# Patient Record
Sex: Female | Born: 1955 | Race: White | Hispanic: No | Marital: Married | Smoking: Never smoker
Health system: Southern US, Community
[De-identification: ages and names within clinical notes are randomized; demographics above are authoritative.]

## PROBLEM LIST (undated history)

## (undated) DIAGNOSIS — I10 Essential (primary) hypertension: Secondary | ICD-10-CM

## (undated) DIAGNOSIS — R569 Unspecified convulsions: Secondary | ICD-10-CM

## (undated) DIAGNOSIS — D332 Benign neoplasm of brain, unspecified: Secondary | ICD-10-CM

## (undated) DIAGNOSIS — M810 Age-related osteoporosis without current pathological fracture: Secondary | ICD-10-CM

## (undated) DIAGNOSIS — D649 Anemia, unspecified: Secondary | ICD-10-CM

## (undated) DIAGNOSIS — Z8679 Personal history of other diseases of the circulatory system: Secondary | ICD-10-CM

## (undated) HISTORY — PX: OTHER SURGICAL HISTORY: SHX169

## (undated) HISTORY — PX: SHOULDER SURGERY: SHX246

---

## 2005-03-31 ENCOUNTER — Ambulatory Visit: Payer: Self-pay | Admitting: Family Medicine

## 2006-07-22 ENCOUNTER — Ambulatory Visit: Payer: Self-pay | Admitting: Family Medicine

## 2008-02-03 ENCOUNTER — Ambulatory Visit: Payer: Self-pay | Admitting: Family Medicine

## 2008-02-17 ENCOUNTER — Ambulatory Visit: Payer: Self-pay | Admitting: Family Medicine

## 2008-06-19 ENCOUNTER — Ambulatory Visit: Payer: Self-pay | Admitting: Unknown Physician Specialty

## 2008-09-06 ENCOUNTER — Ambulatory Visit: Payer: Self-pay | Admitting: Family Medicine

## 2010-05-16 ENCOUNTER — Ambulatory Visit: Payer: Self-pay | Admitting: Family Medicine

## 2012-04-11 ENCOUNTER — Ambulatory Visit: Payer: Self-pay | Admitting: Emergency Medicine

## 2012-09-02 ENCOUNTER — Ambulatory Visit: Payer: Self-pay

## 2014-01-05 DIAGNOSIS — M533 Sacrococcygeal disorders, not elsewhere classified: Secondary | ICD-10-CM | POA: Insufficient documentation

## 2014-10-19 DIAGNOSIS — Z780 Asymptomatic menopausal state: Secondary | ICD-10-CM | POA: Insufficient documentation

## 2014-10-31 ENCOUNTER — Other Ambulatory Visit: Payer: Self-pay | Admitting: Physician Assistant

## 2014-10-31 DIAGNOSIS — Z1231 Encounter for screening mammogram for malignant neoplasm of breast: Secondary | ICD-10-CM

## 2014-11-15 ENCOUNTER — Ambulatory Visit
Admission: RE | Admit: 2014-11-15 | Discharge: 2014-11-15 | Disposition: A | Discharge status: Home / Self Care | Payer: 59 | Source: Ambulatory Visit | Attending: Physician Assistant | Admitting: Physician Assistant

## 2014-11-15 DIAGNOSIS — Z1231 Encounter for screening mammogram for malignant neoplasm of breast: Secondary | ICD-10-CM

## 2015-10-23 ENCOUNTER — Other Ambulatory Visit: Payer: Self-pay | Admitting: Physician Assistant

## 2015-10-23 DIAGNOSIS — Z1231 Encounter for screening mammogram for malignant neoplasm of breast: Secondary | ICD-10-CM

## 2015-11-16 ENCOUNTER — Ambulatory Visit
Admission: RE | Admit: 2015-11-16 | Discharge: 2015-11-16 | Disposition: A | Discharge status: Home / Self Care | Payer: 59 | Source: Ambulatory Visit | Attending: Physician Assistant | Admitting: Physician Assistant

## 2015-11-16 DIAGNOSIS — Z1231 Encounter for screening mammogram for malignant neoplasm of breast: Secondary | ICD-10-CM | POA: Insufficient documentation

## 2016-11-11 ENCOUNTER — Other Ambulatory Visit: Payer: Self-pay | Admitting: Physician Assistant

## 2016-11-11 DIAGNOSIS — I10 Essential (primary) hypertension: Secondary | ICD-10-CM | POA: Insufficient documentation

## 2016-11-11 DIAGNOSIS — Z1231 Encounter for screening mammogram for malignant neoplasm of breast: Secondary | ICD-10-CM

## 2016-11-11 DIAGNOSIS — R7303 Prediabetes: Secondary | ICD-10-CM | POA: Insufficient documentation

## 2016-12-17 ENCOUNTER — Ambulatory Visit
Admission: RE | Admit: 2016-12-17 | Discharge: 2016-12-17 | Disposition: A | Discharge status: Home / Self Care | Payer: 59 | Source: Ambulatory Visit | Attending: Physician Assistant | Admitting: Physician Assistant

## 2016-12-17 DIAGNOSIS — Z1231 Encounter for screening mammogram for malignant neoplasm of breast: Secondary | ICD-10-CM | POA: Diagnosis not present

## 2017-11-11 ENCOUNTER — Other Ambulatory Visit: Payer: Self-pay | Admitting: Physician Assistant

## 2017-11-11 DIAGNOSIS — Z1231 Encounter for screening mammogram for malignant neoplasm of breast: Secondary | ICD-10-CM

## 2017-12-22 ENCOUNTER — Ambulatory Visit
Admission: RE | Admit: 2017-12-22 | Discharge: 2017-12-22 | Disposition: A | Discharge status: Home / Self Care | Payer: 59 | Source: Ambulatory Visit | Attending: Physician Assistant | Admitting: Physician Assistant

## 2017-12-22 ENCOUNTER — Encounter: Payer: Self-pay | Admitting: Radiology

## 2017-12-22 DIAGNOSIS — Z1231 Encounter for screening mammogram for malignant neoplasm of breast: Secondary | ICD-10-CM | POA: Diagnosis present

## 2018-01-17 ENCOUNTER — Emergency Department
Admission: EM | Admit: 2018-01-17 | Discharge: 2018-01-17 | Disposition: A | Discharge status: Home / Self Care | Payer: 59 | Attending: Emergency Medicine | Admitting: Emergency Medicine

## 2018-01-17 ENCOUNTER — Other Ambulatory Visit: Payer: Self-pay

## 2018-01-17 DIAGNOSIS — R55 Syncope and collapse: Secondary | ICD-10-CM | POA: Insufficient documentation

## 2018-01-17 LAB — URINALYSIS, COMPLETE (UACMP) WITH MICROSCOPIC
BILIRUBIN URINE: NEGATIVE
Bacteria, UA: NONE SEEN
GLUCOSE, UA: NEGATIVE mg/dL
KETONES UR: NEGATIVE mg/dL
LEUKOCYTES UA: NEGATIVE
NITRITE: NEGATIVE
PH: 7 (ref 5.0–8.0)
Protein, ur: NEGATIVE mg/dL
SPECIFIC GRAVITY, URINE: 1.012 (ref 1.005–1.030)

## 2018-01-17 LAB — BASIC METABOLIC PANEL
Anion gap: 9 (ref 5–15)
BUN: 9 mg/dL (ref 8–23)
CALCIUM: 9.2 mg/dL (ref 8.9–10.3)
CO2: 27 mmol/L (ref 22–32)
Chloride: 107 mmol/L (ref 98–111)
Creatinine, Ser: 0.72 mg/dL (ref 0.44–1.00)
GFR calc non Af Amer: >60 mL/min (ref >60)
GLUCOSE: 92 mg/dL (ref 70–99)
Potassium: 3.6 mmol/L (ref 3.5–5.1)
Sodium: 143 mmol/L (ref 135–145)

## 2018-01-17 LAB — TROPONIN I: Troponin I: <0.03 ng/mL (ref <0.03)

## 2018-01-17 LAB — MAGNESIUM: Magnesium: 2.2 mg/dL (ref 1.7–2.4)

## 2018-01-17 LAB — GLUCOSE, CAPILLARY: GLUCOSE-CAPILLARY: 88 mg/dL (ref 70–99)

## 2018-01-17 LAB — CBC
HEMATOCRIT: 43.9 % (ref 35.0–47.0)
Hemoglobin: 14.5 g/dL (ref 12.0–16.0)
MCH: 26.6 pg (ref 26.0–34.0)
MCHC: 33 g/dL (ref 32.0–36.0)
MCV: 80.6 fL (ref 80.0–100.0)
Platelets: 203 10*3/uL (ref 150–440)
RBC: 5.45 MIL/uL — ABNORMAL HIGH (ref 3.80–5.20)
RDW: 14.8 % — AB (ref 11.5–14.5)
WBC: 7.4 10*3/uL (ref 3.6–11.0)

## 2018-01-17 MED ORDER — SODIUM CHLORIDE 0.9 % IV BOLUS
1000.0000 mL | Freq: Once | INTRAVENOUS | Status: AC
  Administered 2018-01-17: 1000 mL via INTRAVENOUS

## 2018-01-17 NOTE — ED Provider Notes (Signed)
[facility] Emergency Department Provider Note  ____________________________________________  Time seen: Approximately 1:10 PM  I have reviewed the triage vital signs and the nursing notes.   HISTORY  Chief Complaint Loss of Consciousness   HPI Barbara Burch is a 62 y.o. female with a history of hypertension, prediabetes,  anemia, and migraines who presents for evaluation of a syncopal event.  Patient reports that this morning she had a bowl of cereal and went to church.  She was sitting on the choir.  She says that she was trying to sing louder than the men and was really exerting herself.  She was walking out of the stage when she felt dizzy and passed out falling on her husband was behind her.  She did not injure herself.  She was out for a few seconds.  No seizure-like activity, no urinary or bowel loss.  Patient reports having one episode of syncope several years ago.  She denies headache, palpitations, chest pain, shortness of breath, back pain preceding or after the syncope.  Patient reports that for the last month she has been having occasional episodes of chest pain that she describes as pressure located in the center of her chest that she has been attributing to GERD.  She reports that the pain comes on at rest or exertion, is not worse after eating, and may last an hour at a time.  She has no nausea, diaphoresis, or shortness of breath associated with it.  She reports that her last episode of pain was a week ago.  Patient reports family history of cardiac disease in her mother and uncles.  She has never smoked.  PMH Essential hypertension 11/11/2016  Prediabetes 11/11/2016  Postmenopausal 10/19/2014  SI (sacroiliac) joint dysfunction 01/05/2014  Anemia, unspecified   Seasonal allergies   Migraines   History of epilepsy   Overview:   remote history   Osteopenia of multiple sites      Allergies Amoxicillin; Cefuroxime axetil; Codeine;  Ibuprofen; and Sulfa antibiotics  Family History  Problem Relation Age of Onset  . Breast cancer Cousin     Social History Social History   Tobacco Use  . Smoking status: Never Smoker  . Smokeless tobacco: Never Used  Substance Use Topics  . Alcohol use: Never    Frequency: Never  . Drug use: Never    Review of Systems  Constitutional: Negative for fever. + syncope Eyes: Negative for visual changes. ENT: Negative for sore throat. Neck: No neck pain  Cardiovascular: Negative for chest pain. Respiratory: Negative for shortness of breath. Gastrointestinal: Negative for abdominal pain, vomiting or diarrhea. Genitourinary: Negative for dysuria. Musculoskeletal: Negative for back pain. Skin: Negative for rash. Neurological: Negative for headaches, weakness or numbness. Psych: No SI or HI  ____________________________________________   PHYSICAL EXAM:  VITAL SIGNS: ED Triage Vitals  Enc Vitals Group     BP 01/17/18 1217 (!) 153/76     Pulse Rate 01/17/18 1218 76     Resp 01/17/18 1218 14     Temp 01/17/18 1240 97.8 F (36.6 C)     Temp Source 01/17/18 1240 Oral     SpO2 01/17/18 1218 99 %     Weight 01/17/18 1241 180 lb (81.6 kg)     Height 01/17/18 1241 5\' 2"  (1.575 m)     Head Circumference --      Peak Flow --      Pain Score 01/17/18 1241 0     Pain Loc --  Pain Edu? --      Excl. in [place]? --     Constitutional: Alert and oriented. Well appearing and in no apparent distress. HEENT:      Head: Normocephalic and atraumatic.         Eyes: Conjunctivae are normal. Sclera is non-icteric.       Mouth/Throat: Mucous membranes are moist.       Neck: Supple with no signs of meningismus. Cardiovascular: Regular rate and rhythm. No murmurs, gallops, or rubs. 2+ symmetrical distal pulses are present in all extremities. No JVD. Respiratory: Normal respiratory effort. Lungs are clear to auscultation bilaterally. No wheezes, crackles, or rhonchi.  Gastrointestinal:  Soft, non tender, and non distended with positive bowel sounds. No rebound or guarding. Musculoskeletal: Nontender with normal range of motion in all extremities. No edema, cyanosis, or erythema of extremities. Neurologic: Normal speech and language. A & O x3, PERRL, EOMI, no nystagmus, CN II-XII intact, motor testing reveals good tone and bulk throughout. There is no evidence of pronator drift or dysmetria. Muscle strength is 5/5 throughout. Sensory examination is intact. Gait deferred. Skin: Skin is warm, dry and intact. No rash noted. Psychiatric: Mood and affect are normal. Speech and behavior are normal.  ____________________________________________   LABS (all labs ordered are listed, but only abnormal results are displayed)  Labs Reviewed  CBC - Abnormal; Notable for the following components:      Result Value   RBC 5.45 (*)    RDW 14.8 (*)    All other components within normal limits  URINALYSIS, COMPLETE (UACMP) WITH MICROSCOPIC - Abnormal; Notable for the following components:   Color, Urine YELLOW (*)    APPearance CLEAR (*)    Hgb urine dipstick SMALL (*)    All other components within normal limits  GLUCOSE, CAPILLARY  BASIC METABOLIC PANEL  TROPONIN I  MAGNESIUM   ____________________________________________  EKG  ED ECG REPORT I, Rudene Re, the attending physician, personally viewed and interpreted this ECG.  Normal sinus rhythm, normal intervals, normal axis, no STE or depressions, no evidence of HOCM, AV block, delta wave, ARVD, prolonged QTc, WPW, or Brugada.   ____________________________________________  RADIOLOGY  none ____________________________________________   PROCEDURES  Procedure(s) performed: None Procedures Critical Care performed:  None ____________________________________________   INITIAL IMPRESSION / ASSESSMENT AND PLAN / ED COURSE   62 y.o. female with a history of hypertension, prediabetes,  anemia, and migraines who  presents for evaluation of a syncopal event preceded by dizziness after sitting in the choir at church.  Patient has no complaints at this time.  She is well-appearing, no distress, she has normal vital signs, she is completely neurologically intact, she has no murmurs on cardiac auscultation, she has no leg swelling.  EKG shows no evidence of dysrhythmias or ischemia.  Will check labs to rule out anemia, AKI, electrolyte abnormalities, dehydration.  Will give IV fluids.  Will monitor on telemetry to rule out cardiac dysrhythmia.  Clinical Course as of Jan 18 1511  Sun Jan 17, 2018  1511 Patient reports that her dizziness has resolved after IV fluids.  Labs showing no electrolyte abnormalities, no anemia, no dehydration, no hypoglycemia.  Patient was monitored on telemetry for several hours with no dysrhythmias.  At this time she is stable for discharge home.  Patient will be referred to cardiology for further evaluation for these episodes of chest pain and syncope.  Discussed return precautions for chest pain, syncope, shortness of breath.   [CV]  Clinical Course User Index [CV] Alfred Levins [place], MD     As part of my medical decision making, I reviewed the following data within the [place] History obtained from family, Nursing notes reviewed and incorporated, Labs reviewed , EKG interpreted , Old chart reviewed, Notes from prior ED visits and [place] Controlled Substance Database    Pertinent labs & imaging results that were available during my care of the patient were reviewed by me and considered in my medical decision making (see chart for details).    ____________________________________________   FINAL CLINICAL IMPRESSION(S) / ED DIAGNOSES  Final diagnoses:  Syncope, unspecified syncope type      NEW MEDICATIONS STARTED DURING THIS VISIT:  ED Discharge Orders    None       Note:  This document was prepared using Dragon voice recognition software and may  include unintentional dictation errors.    Alfred Levins, [place], MD 01/17/18 [phone]

## 2018-01-17 NOTE — ED Notes (Signed)
Pt ambulated to restroom, steady gait, stand by assist.

## 2018-01-18 ENCOUNTER — Other Ambulatory Visit: Payer: Self-pay | Admitting: Physician Assistant

## 2018-01-18 ENCOUNTER — Ambulatory Visit
Admission: RE | Admit: 2018-01-18 | Discharge: 2018-01-18 | Disposition: A | Discharge status: Home / Self Care | Payer: 59 | Source: Ambulatory Visit | Attending: Physician Assistant | Admitting: Physician Assistant

## 2018-01-18 DIAGNOSIS — R55 Syncope and collapse: Secondary | ICD-10-CM | POA: Insufficient documentation

## 2018-01-21 DIAGNOSIS — E782 Mixed hyperlipidemia: Secondary | ICD-10-CM | POA: Insufficient documentation

## 2018-03-09 DIAGNOSIS — R0789 Other chest pain: Secondary | ICD-10-CM | POA: Insufficient documentation

## 2018-06-02 ENCOUNTER — Emergency Department: Payer: 59

## 2018-06-02 ENCOUNTER — Encounter: Payer: Self-pay | Admitting: Emergency Medicine

## 2018-06-02 ENCOUNTER — Other Ambulatory Visit: Payer: Self-pay

## 2018-06-02 ENCOUNTER — Emergency Department
Admission: EM | Admit: 2018-06-02 | Discharge: 2018-06-02 | Disposition: A | Discharge status: Home / Self Care | Payer: 59 | Attending: Emergency Medicine | Admitting: Emergency Medicine

## 2018-06-02 DIAGNOSIS — Z79899 Other long term (current) drug therapy: Secondary | ICD-10-CM | POA: Diagnosis not present

## 2018-06-02 DIAGNOSIS — R079 Chest pain, unspecified: Secondary | ICD-10-CM | POA: Diagnosis present

## 2018-06-02 DIAGNOSIS — I10 Essential (primary) hypertension: Secondary | ICD-10-CM | POA: Insufficient documentation

## 2018-06-02 DIAGNOSIS — E041 Nontoxic single thyroid nodule: Secondary | ICD-10-CM | POA: Diagnosis not present

## 2018-06-02 DIAGNOSIS — R1011 Right upper quadrant pain: Secondary | ICD-10-CM | POA: Insufficient documentation

## 2018-06-02 DIAGNOSIS — K8050 Calculus of bile duct without cholangitis or cholecystitis without obstruction: Secondary | ICD-10-CM | POA: Diagnosis not present

## 2018-06-02 DIAGNOSIS — R911 Solitary pulmonary nodule: Secondary | ICD-10-CM | POA: Diagnosis not present

## 2018-06-02 HISTORY — DX: Age-related osteoporosis without current pathological fracture: M81.0

## 2018-06-02 HISTORY — DX: Essential (primary) hypertension: I10

## 2018-06-02 HISTORY — DX: Unspecified convulsions: R56.9

## 2018-06-02 LAB — LIPASE, BLOOD: LIPASE: 30 U/L (ref 11–51)

## 2018-06-02 LAB — CBC WITH DIFFERENTIAL/PLATELET
Abs Immature Granulocytes: 0.03 10*3/uL (ref 0.00–0.07)
BASOS ABS: 0 10*3/uL (ref 0.0–0.1)
Basophils Relative: 0 %
Eosinophils Absolute: 0.1 10*3/uL (ref 0.0–0.5)
Eosinophils Relative: 1 %
HCT: 43.7 % (ref 36.0–46.0)
HEMOGLOBIN: 13.8 g/dL (ref 12.0–15.0)
IMMATURE GRANULOCYTES: 0 %
LYMPHS PCT: 13 %
Lymphs Abs: 1.2 10*3/uL (ref 0.7–4.0)
MCH: 26.1 pg (ref 26.0–34.0)
MCHC: 31.6 g/dL (ref 30.0–36.0)
MCV: 82.8 fL (ref 80.0–100.0)
Monocytes Absolute: 0.4 10*3/uL (ref 0.1–1.0)
Monocytes Relative: 5 %
NEUTROS PCT: 81 %
NRBC: 0 % (ref 0.0–0.2)
Neutro Abs: 7.7 10*3/uL (ref 1.7–7.7)
Platelets: 215 10*3/uL (ref 150–400)
RBC: 5.28 MIL/uL — AB (ref 3.87–5.11)
RDW: 13.6 % (ref 11.5–15.5)
WBC: 9.4 10*3/uL (ref 4.0–10.5)

## 2018-06-02 LAB — COMPREHENSIVE METABOLIC PANEL
ALT: 15 U/L (ref 0–44)
ANION GAP: 9 (ref 5–15)
AST: 26 U/L (ref 15–41)
Albumin: 4.1 g/dL (ref 3.5–5.0)
Alkaline Phosphatase: 94 U/L (ref 38–126)
BUN: 9 mg/dL (ref 8–23)
CHLORIDE: 103 mmol/L (ref 98–111)
CO2: 29 mmol/L (ref 22–32)
CREATININE: 0.74 mg/dL (ref 0.44–1.00)
Calcium: 9.1 mg/dL (ref 8.9–10.3)
Glucose, Bld: 134 mg/dL — ABNORMAL HIGH (ref 70–99)
POTASSIUM: 3.5 mmol/L (ref 3.5–5.1)
SODIUM: 141 mmol/L (ref 135–145)
Total Bilirubin: 0.4 mg/dL (ref 0.3–1.2)
Total Protein: 7.7 g/dL (ref 6.5–8.1)

## 2018-06-02 LAB — FIBRIN DERIVATIVES D-DIMER (ARMC ONLY): Fibrin derivatives D-dimer (ARMC): 612.34 ng/mL (FEU) — ABNORMAL HIGH (ref 0.00–499.00)

## 2018-06-02 LAB — TROPONIN I
Troponin I: <0.03 ng/mL (ref <0.03)
Troponin I: <0.03 ng/mL (ref <0.03)

## 2018-06-02 LAB — GLUCOSE, CAPILLARY: Glucose-Capillary: 109 mg/dL — ABNORMAL HIGH (ref 70–99)

## 2018-06-02 MED ORDER — SODIUM CHLORIDE 0.9 % IV BOLUS
1000.0000 mL | Freq: Once | INTRAVENOUS | Status: AC
  Administered 2018-06-02: 1000 mL via INTRAVENOUS

## 2018-06-02 MED ORDER — ONDANSETRON HCL 4 MG/2ML IJ SOLN
4.0000 mg | Freq: Once | INTRAMUSCULAR | Status: AC
  Administered 2018-06-02: 4 mg via INTRAVENOUS
  Filled 2018-06-02: qty 2

## 2018-06-02 MED ORDER — IOHEXOL 350 MG/ML SOLN
75.0000 mL | Freq: Once | INTRAVENOUS | Status: AC | PRN
  Administered 2018-06-02: 75 mL via INTRAVENOUS

## 2018-06-02 MED ORDER — FAMOTIDINE IN NACL 20-0.9 MG/50ML-% IV SOLN
20.0000 mg | Freq: Once | INTRAVENOUS | Status: AC
  Administered 2018-06-02: 20 mg via INTRAVENOUS
  Filled 2018-06-02: qty 50

## 2018-06-02 MED ORDER — MORPHINE SULFATE (PF) 4 MG/ML IV SOLN
4.0000 mg | Freq: Once | INTRAVENOUS | Status: AC
  Administered 2018-06-02: 4 mg via INTRAVENOUS
  Filled 2018-06-02: qty 1

## 2018-06-02 MED ORDER — TRAMADOL HCL 50 MG PO TABS: 50.0000 mg | ORAL_TABLET | Freq: Four times a day (QID) | ORAL | 0 refills | Status: AC | PRN

## 2018-06-02 MED ORDER — ASPIRIN 325 MG PO TABS
325.0000 mg | ORAL_TABLET | Freq: Once | ORAL | Status: AC
  Administered 2018-06-02: 325 mg via ORAL
  Filled 2018-06-02: qty 1

## 2018-06-02 MED ORDER — ONDANSETRON 4 MG PO TBDP: ORAL_TABLET | ORAL | 0 refills | Status: AC

## 2018-06-02 MED ORDER — PROMETHAZINE HCL 25 MG/ML IJ SOLN
12.5000 mg | Freq: Once | INTRAMUSCULAR | Status: AC
  Administered 2018-06-02: 12.5 mg via INTRAVENOUS
  Filled 2018-06-02: qty 1

## 2018-06-02 NOTE — ED Notes (Signed)
Patient transported to X-ray 

## 2018-06-02 NOTE — ED Notes (Signed)
Pt ambulatory to bathroom w/ assist. States she feels weak d/t not eating since last night

## 2018-06-02 NOTE — ED Notes (Signed)
ED Provider at bedside. 

## 2018-06-02 NOTE — ED Notes (Signed)
Walked pt to the restroom. Walked pt back to bed. Sent urine to lab.

## 2018-06-02 NOTE — ED Triage Notes (Signed)
Pt c/o mid sternal cp radiating to back, described as sharp, that awoke her out of sleep today at 0430AM. Pt endorses nausea and SOB since CP began.   Pt alert and oriented X4, active, cooperative, pt in NAD. RR even and unlabored, color WNL.

## 2018-06-02 NOTE — ED Notes (Signed)
Patient transported to Ultrasound 

## 2018-06-02 NOTE — ED Provider Notes (Signed)
[facility] EMERGENCY DEPARTMENT Provider Note   CSN: [id number] Arrival date & time: 06/02/18  2585     History   Chief Complaint Chief Complaint  Patient presents with  . Chest Pain    HPI Barbara Burch is a 62 y.o. female history of hypertension, previous syncope here presenting with chest pain, shortness of breath.  Patient states that she woke around 4:30 AM with acute onset of left-sided chest pain associated with some shortness of breath.  States that the pain is sharp and does radiate to her back.  Patient states that she felt lightheaded and dizzy like her she is going to pass out as well.  She came to the ED in July for syncope and had a negative troponins.  He also followed up with Dr. Nehemiah Massed from cardiology and had an echo that showed mild mitral regurgitation and a normal stress test.  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  Patient denies any recent travel.  Patient took some Pepto-Bismol with minimal relief.  The history is provided by the patient.    Past Medical History:  Diagnosis Date  . Hypertension   . Osteoporosis   . Seizures ([place])     There are no active problems to display for this patient.   History reviewed. No pertinent surgical history.   OB History   None      Home Medications    Prior to Admission medications   Medication Sig Start Date End Date Taking? Authorizing Provider  Calcium Carbonate-Vit D-Min (CALTRATE 600+D PLUS MINERALS) 600-800 MG-UNIT TABS Take 1 tablet by mouth daily.   Yes [provider]  Cholecalciferol (D3 HIGH POTENCY) 2000 units CAPS Take 2,000 Units by mouth daily.   Yes [provider]  Cyanocobalamin (VITAMIN B-12) 1000 MCG SUBL Take 1,000 mcg by mouth daily.   Yes [provider]    Family History Family History  Problem Relation Age of Onset  . Breast cancer Cousin     Social  History Social History   Tobacco Use  . Smoking status: Never Smoker  . Smokeless tobacco: Never Used  Substance Use Topics  . Alcohol use: Never    Frequency: Never  . Drug use: Never     Allergies   Amoxicillin; Cefuroxime axetil; Codeine; Ibuprofen; and Sulfa antibiotics   Review of Systems Review of Systems  Cardiovascular: Positive for chest pain.  All other systems reviewed and are negative.    Physical Exam Updated Vital Signs BP 133/74   Pulse 73   Temp 97.7 F (36.5 C) (Oral)   Resp 13   Ht 4\' 10"  (1.473 m)   Wt 71.7 kg   SpO2 98%   BMI 33.02 kg/m   Physical Exam  Constitutional: She is oriented to person, place, and time.  Uncomfortable   HENT:  Head: Normocephalic.  Eyes: Pupils are equal, round, and reactive to light. EOM are normal.  Neck: Normal range of motion. Neck supple.  Cardiovascular: Normal rate, regular rhythm and normal pulses.  Pulmonary/Chest: Effort normal and breath sounds normal.  No obvious reproducible tenderness   Abdominal: Soft. Bowel sounds are normal.  Musculoskeletal: Normal range of motion.       Right lower leg: Normal. She exhibits no tenderness and no edema.       Left lower leg: Normal. She exhibits no tenderness and no edema.  Neurological: She is alert and  oriented to person, place, and time.  Skin: Skin is warm. Capillary refill takes less than 2 seconds.  Psychiatric: She has a normal mood and affect. Her behavior is normal.  Nursing note and vitals reviewed.    ED Treatments / Results  Labs (all labs ordered are listed, but only abnormal results are displayed) Labs Reviewed  COMPREHENSIVE METABOLIC PANEL - Abnormal; Notable for the following components:      Result Value   Glucose, Bld 134 (*)    All other components within normal limits  CBC WITH DIFFERENTIAL/PLATELET - Abnormal; Notable for the following components:   RBC 5.28 (*)    All other components within normal limits  FIBRIN DERIVATIVES  D-DIMER ([facility] ONLY) - Abnormal; Notable for the following components:   Fibrin derivatives D-dimer [facility]) 612.34 (*)    All other components within normal limits  GLUCOSE, CAPILLARY - Abnormal; Notable for the following components:   Glucose-Capillary 109 (*)    All other components within normal limits  TROPONIN I  TROPONIN I  LIPASE, BLOOD    EKG None  Radiology Dg Chest 2 View  Result Date: 06/02/2018 CLINICAL DATA:  Mid chest pain, nausea, vomiting, shortness of breath EXAM: CHEST - 2 VIEW COMPARISON:  None. FINDINGS: Heart and mediastinal contours are within normal limits. No focal opacities or effusions. No acute bony abnormality. IMPRESSION: No active cardiopulmonary disease. Electronically Signed   By: Rolm Baptise M.D.   On: 06/02/2018 09:51   Ct Angio Chest Pe W And/or Wo Contrast  Result Date: 06/02/2018 CLINICAL DATA:  Chest pain and shortness of breath. Elevated D-dimer EXAM: CT ANGIOGRAPHY CHEST WITH CONTRAST TECHNIQUE: Multidetector CT imaging of the chest was performed using the standard protocol during bolus administration of intravenous contrast. Multiplanar CT image reconstructions and MIPs were obtained to evaluate the vascular anatomy. CONTRAST:  67mL OMNIPAQUE IOHEXOL 350 MG/ML SOLN COMPARISON:  Chest radiograph June 02, 2018 FINDINGS: Cardiovascular: There is no demonstrable pulmonary embolus. There is no thoracic aortic aneurysm or dissection. Visualized great vessels appear unremarkable. No pericardial effusion or pericardial thickening evident. Mediastinum/Nodes: There is a nodular lesion in the isthmus of the thyroid toward the left measuring 9 x 9 mm. Thyroid otherwise appears unremarkable. There is no appreciable thoracic adenopathy. Subcentimeter thoracic lymph nodes are noted which do not meet size criteria for pathologic significance. No esophageal lesions are evident. Lungs/Pleura: There is atelectatic change in the lung bases. There are no foci of edema or  consolidation. There is a nodular opacity in the anterior segment of the right upper lobe seen on axial slice 22 series 6 measuring 4 mm. There is a 1 mm nodular opacity in the anterior segment of the right upper lobe seen on axial slice 27 series 6. There is a 2 mm nodular opacity in the medial segment of the right lower lobe seen on axial slice 35 series 6. There is a 1 mm nodular opacity in the superior segment of the right lower lobe seen on axial slice 27 series 6. There is a 4 mm nodular opacity in the medial segment right lower lobe seen on axial slice 33 series 6. There is a 4 mm nodular opacity in the lateral segment of the right middle lobe seen on axial slice 45 series 6. There is a 2 mm nodular opacity in the apical segment of the left upper lobe seen on axial slice 8 series 6. No pleural effusion or pleural thickening evident. Upper Abdomen: Visualized upper abdominal structures appear  unremarkable. Musculoskeletal: There are foci of degenerative change in the thoracic spine. There are no blastic or lytic bone lesions. No intramuscular lesions are evident. Review of the MIP images confirms the above findings. IMPRESSION: 1. No demonstrable pulmonary embolus. No thoracic aortic aneurysm or dissection. 2. No lung edema or consolidation. Several nodular opacities seen in the lungs, largest measuring 4 mm. No follow-up needed if patient is low-risk (and has no known or suspected primary neoplasm). Non-contrast chest CT can be considered in 12 months if patient is high-risk. This recommendation follows the consensus statement: Guidelines for Management of Incidental Pulmonary Nodules Detected on CT Images: From the Fleischner Society 2017; Radiology 2017; 284:228-243. 3. 9 mm nodule in the isthmus of the thyroid on the left. Per consensus guidelines, a thyroid nodule of this size does not warrant additional imaging surveillance. 4. Occasional subcentimeter mediastinal lymph nodes. No adenopathy evident by  size criteria. Electronically Signed   By: Lowella Grip III M.D.   On: 06/02/2018 11:10   US Abdomen Limited Ruq  Result Date: 06/02/2018 CLINICAL DATA:  Right upper quadrant pain EXAM: ULTRASOUND ABDOMEN LIMITED RIGHT UPPER QUADRANT COMPARISON:  None. FINDINGS: Gallbladder: Within the gallbladder, there are echogenic foci which move and shadow consistent with cholelithiasis. Largest gallstone measures 2.4 cm in length. There is no gallbladder wall thickening or pericholecystic fluid. No sonographic Murphy sign noted by sonographer. Common bile duct: Diameter: 5 mm. No intrahepatic or extrahepatic biliary duct dilatation. Liver: No focal lesion identified. Liver echogenicity overall is increased. Portal vein is patent on color Doppler imaging with normal direction of blood flow towards the liver. IMPRESSION: 1. Cholelithiasis. No gallbladder wall thickening or pericholecystic fluid. 2. Increase in liver echogenicity, a finding indicative of hepatic steatosis. While no focal liver lesions are evident on this study, it must be cautioned that the sensitivity of ultrasound for detection of focal liver lesions is diminished in this circumstance. Electronically Signed   By: Lowella Grip III M.D.   On: 06/02/2018 14:39    Procedures Procedures (including critical care time)  Medications Ordered in ED Medications  famotidine (PEPCID) IVPB 20 mg premix ( Intravenous Stopped 06/02/18 1031)  aspirin tablet 325 mg (325 mg Oral Given 06/02/18 0950)  morphine 4 MG/ML injection 4 mg (4 mg Intravenous Given 06/02/18 0949)  ondansetron (ZOFRAN) injection 4 mg (4 mg Intravenous Given 06/02/18 0949)  sodium chloride 0.9 % bolus 1,000 mL (0 mLs Intravenous Stopped 06/02/18 1105)  iohexol (OMNIPAQUE) 350 MG/ML injection 75 mL (75 mLs Intravenous Contrast Given 06/02/18 1056)  promethazine (PHENERGAN) injection 12.5 mg (12.5 mg Intravenous Given 06/02/18 1436)  sodium chloride 0.9 % bolus 1,000 mL (1,000 mLs  Intravenous New Bag/Given 06/02/18 1433)     Initial Impression / Assessment and Plan / ED Course  I have reviewed the triage vital signs and the nursing notes.  Pertinent labs & imaging results that were available during my care of the patient were reviewed by me and considered in my medical decision making (see chart for details).    Barbara Burch is a 62 y.o. female here with chest pain, back pain. Patient not hypertensive. Low suspicion for dissection. Consider ACS vs reflux vs MSK vs PE. Had recent stress test that was normal. Will get labs, Trop x 2, d-dimer. Will give pepcid, ASA, pain meds and reassess.   3:13 PM D-dimer was positive, delta trop neg. Patient had some nausea and vomiting and told me that she has family hx of gallstones. US  showed cholelithiasis, but no acute chole. No vomiting after nausea meds. Stable for dc. Will dc home with zofran, pain meds, surgery follow up.    Final Clinical Impressions(s) / ED Diagnoses   Final diagnoses:  RUQ pain    ED Discharge Orders    None       Drenda Freeze, MD 06/02/18 1514

## 2018-06-02 NOTE — Discharge Instructions (Addendum)
Eat low fat diet.   You have gallstones.   Take tylenol for pain   Take tramadol for severe pain.   Take zofran for nausea. Stay hydrated.   See your doctor.   See surgery for your gallstones.   You have incidental findings of thyroid nodule and lung nodule that can be followed up with your doctor  Return to ER if you have worse abdominal pain, vomiting, chest pain, trouble breathing, fever

## 2018-06-09 ENCOUNTER — Other Ambulatory Visit: Payer: Self-pay

## 2018-06-09 ENCOUNTER — Encounter: Payer: Self-pay | Admitting: General Surgery

## 2018-06-09 ENCOUNTER — Ambulatory Visit (INDEPENDENT_AMBULATORY_CARE_PROVIDER_SITE_OTHER): Payer: 59 | Admitting: General Surgery

## 2018-06-09 VITALS — BP 152/85 | HR 63 | Temp 97.7°F | Resp 16 | Ht 59.0 in | Wt 157.2 lb

## 2018-06-09 DIAGNOSIS — K8020 Calculus of gallbladder without cholecystitis without obstruction: Secondary | ICD-10-CM | POA: Diagnosis not present

## 2018-06-09 DIAGNOSIS — J302 Other seasonal allergic rhinitis: Secondary | ICD-10-CM | POA: Insufficient documentation

## 2018-06-09 DIAGNOSIS — G43909 Migraine, unspecified, not intractable, without status migrainosus: Secondary | ICD-10-CM | POA: Insufficient documentation

## 2018-06-09 DIAGNOSIS — D649 Anemia, unspecified: Secondary | ICD-10-CM | POA: Insufficient documentation

## 2018-06-09 DIAGNOSIS — M8589 Other specified disorders of bone density and structure, multiple sites: Secondary | ICD-10-CM | POA: Insufficient documentation

## 2018-06-09 DIAGNOSIS — Z8669 Personal history of other diseases of the nervous system and sense organs: Secondary | ICD-10-CM | POA: Insufficient documentation

## 2018-06-09 NOTE — Progress Notes (Signed)
Patient ID: Barbara Burch, female   DOB: 1955/07/31, 62 y.o.   MRN: [id number]  Chief Complaint  Patient presents with  . New Patient (Initial Visit)    cholelithiais    HPI Barbara Burch is a 62 y.o. female.  She was recently seen in the ED for chest pain.  The HPI of her ED note is copied here:  "Barbara Burch is a 62 y.o. female history of hypertension, previous syncope here presenting with chest pain, shortness of breath.  Patient states that she woke around 4:30 AM with acute onset of left-sided chest pain associated with some shortness of breath.  States that the pain is sharp and does radiate to her back.  Patient states that she felt lightheaded and dizzy like her she is going to pass out as well.  She came to the ED in July for syncope and had a negative troponins.  He also followed up with Dr. Nehemiah Massed from cardiology and had an echo that showed mild mitral regurgitation and a normal stress test.  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  Patient denies any recent travel.  Patient took some Pepto-Bismol with minimal relief."  After the patient reported a history of cholelithiasis in her family, the evaluation took a different tack.  A RUQ U/S was performed that revealed multiple stones, the largest measuring 2.4 cm.  Once all of the life-threatening etiologies were ruled out, she was sent home and scheduled to see me in follow up.    Today, she reports that her episode that brought her to the ED was accompanied by bloating, nausea, and chills.  These have resolved. She gives a history of nearly daily chest pain (points to mid sternum) that is present upon waking and goes away in about 30 minutes. She has never been jaundiced, had pancreatitis, or experienced tea-colored urine or clay-colored stools.  She did have PUD in her 95's.     Past Medical History:  Diagnosis Date  . Hypertension   . Osteoporosis   . Seizures  ([place])     Past Surgical History:  Procedure Laterality Date  . CESAREAN SECTION    . fooft  Right   . SHOULDER SURGERY Left     Family History  Problem Relation Age of Onset  . Colon cancer Cousin   . Gallbladder disease Mother   . Heart failure Mother   . Cancer Maternal Grandfather     Social History Social History   Tobacco Use  . Smoking status: Never Smoker  . Smokeless tobacco: Never Used  Substance Use Topics  . Alcohol use: Never    Frequency: Never  . Drug use: Never    Allergies  Allergen Reactions  . Amoxicillin Nausea Only and Other (See Comments)    Jitteriness Has patient had a PCN reaction causing immediate rash, facial/tongue/throat swelling, SOB or lightheadedness with hypotension: No Has patient had a PCN reaction causing severe rash involving mucus membranes or skin necrosis: No Has patient had a PCN reaction that required hospitalization: No Has patient had a PCN reaction occurring within the last 10 years: No If all of the above answers are "NO", then may proceed with Cephalosporin use.   . Cefuroxime Axetil Other (See Comments)  . Codeine Other (See Comments)    Loss of consciousness  . Ibuprofen Other (See Comments)    Shaky.  ALEVE OK.  Marland Kitchen Penicillins Nausea And  Vomiting and Nausea Only    Vomiting, dizzy.   . Sulfa Antibiotics Nausea Only    Does not work.    Current Outpatient Medications  Medication Sig Dispense Refill  . Calcium Carbonate-Vit D-Min (CALTRATE 600+D PLUS MINERALS) 600-800 MG-UNIT TABS Take 1 tablet by mouth daily.    . Cholecalciferol (D3 HIGH POTENCY) 2000 units CAPS Take 2,000 Units by mouth daily.    . Cyanocobalamin (VITAMIN B-12) 1000 MCG SUBL Take 1,000 mcg by mouth daily.    . ondansetron (ZOFRAN ODT) 4 MG disintegrating tablet 4mg  ODT q4 hours prn nausea/vomit 10 tablet 0  . traMADol (ULTRAM) 50 MG tablet Take 1 tablet (50 mg total) by mouth every 6 (six) hours as needed. 10 tablet 0   No current  facility-administered medications for this visit.     Review of Systems Review of Systems  All other systems reviewed and are negative. or per the HPI  Blood pressure (!) 152/85, pulse 63, temperature 97.7 F (36.5 C), temperature source Temporal, resp. rate 16, height 4\' 11"  (1.499 m), weight 157 lb 3.2 oz (71.3 kg), SpO2 95 %.  Physical Exam Physical Exam  Constitutional: She is oriented to person, place, and time. She appears well-developed and well-nourished. No distress.  HENT:  Head: Normocephalic and atraumatic.  Mouth/Throat: Oropharynx is clear and moist. No oropharyngeal exudate.  Eyes: Pupils are equal, round, and reactive to light. Right eye exhibits no discharge. Left eye exhibits no discharge. No scleral icterus.  Neck: No tracheal deviation present. No thyromegaly present.  Cardiovascular: Normal rate, regular rhythm, normal heart sounds and intact distal pulses.  Pulmonary/Chest: Effort normal and breath sounds normal.  Abdominal: Soft. Bowel sounds are normal. She exhibits no mass. There is tenderness. There is no rebound and no guarding.  To deep palpation in the RUQ. No Murphy sign.   Musculoskeletal: She exhibits no edema or deformity.  Lymphadenopathy:    She has no cervical adenopathy.  Neurological: She is alert and oriented to person, place, and time.  Skin: Skin is warm and dry.  Psychiatric: She has a normal mood and affect.    Data Reviewed Results for Barbara Burch, Barbara Burch (MRN [id number]) as of 06/09/2018 13:54  Ref. Range 06/02/2018 09:16  Sodium Latest Ref Range: 135 - 145 mmol/L 141  Potassium Latest Ref Range: 3.5 - 5.1 mmol/L 3.5  Chloride Latest Ref Range: 98 - 111 mmol/L 103  CO2 Latest Ref Range: 22 - 32 mmol/L 29  Glucose Latest Ref Range: 70 - 99 mg/dL 134 (H)  BUN Latest Ref Range: 8 - 23 mg/dL 9  Creatinine Latest Ref Range: 0.44 - 1.00 mg/dL 0.74  Calcium Latest Ref Range: 8.9 - 10.3 mg/dL 9.1  Anion gap Latest Ref Range: 5 - 15  9  Alkaline  Phosphatase Latest Ref Range: 38 - 126 U/L 94  Albumin Latest Ref Range: 3.5 - 5.0 g/dL 4.1  AST Latest Ref Range: 15 - 41 U/L 26  ALT Latest Ref Range: 0 - 44 U/L 15  Total Protein Latest Ref Range: 6.5 - 8.1 g/dL 7.7  Total Bilirubin Latest Ref Range: 0.3 - 1.2 mg/dL 0.4  GFR, Est Non African American Latest Ref Range: >60 mL/min >60  GFR, Est African American Latest Ref Range: >60 mL/min >60  Troponin I Latest Ref Range: <0.03 ng/mL <0.03  WBC Latest Ref Range: 4.0 - 10.5 K/uL 9.4  RBC Latest Ref Range: 3.87 - 5.11 MIL/uL 5.28 (H)  Hemoglobin Latest Ref Range: 12.0 -  15.0 g/dL 13.8  HCT Latest Ref Range: 36.0 - 46.0 % 43.7  MCV Latest Ref Range: 80.0 - 100.0 fL 82.8  MCH Latest Ref Range: 26.0 - 34.0 pg 26.1  MCHC Latest Ref Range: 30.0 - 36.0 g/dL 31.6  RDW Latest Ref Range: 11.5 - 15.5 % 13.6  Platelets Latest Ref Range: 150 - 400 K/uL 215  nRBC Latest Ref Range: 0.0 - 0.2 % 0.0  Neutrophils Latest Units: % 81  Lymphocytes Latest Units: % 13  Monocytes Relative Latest Units: % 5  Eosinophil Latest Units: % 1  Basophil Latest Units: % 0  Immature Granulocytes Latest Units: % 0  NEUT# Latest Ref Range: 1.7 - 7.7 K/uL 7.7  Lymphocyte # Latest Ref Range: 0.7 - 4.0 K/uL 1.2  Monocyte # Latest Ref Range: 0.1 - 1.0 K/uL 0.4  Eosinophils Absolute Latest Ref Range: 0.0 - 0.5 K/uL 0.1  Basophils Absolute Latest Ref Range: 0.0 - 0.1 K/uL 0.0  Abs Immature Granulocytes Latest Ref Range: 0.00 - 0.07 K/uL 0.03  CLINICAL DATA:  Right upper quadrant pain  EXAM: ULTRASOUND ABDOMEN LIMITED RIGHT UPPER QUADRANT  COMPARISON:  None.  FINDINGS: Gallbladder:  Within the gallbladder, there are echogenic foci which move and shadow consistent with cholelithiasis. Largest gallstone measures 2.4 cm in length. There is no gallbladder wall thickening or pericholecystic fluid. No sonographic Murphy sign noted by sonographer.  Common bile duct:  Diameter: 5 mm. No intrahepatic or  extrahepatic biliary duct dilatation.  Liver:  No focal lesion identified. Liver echogenicity overall is increased. Portal vein is patent on color Doppler imaging with normal direction of blood flow towards the liver.  IMPRESSION: 1. Cholelithiasis. No gallbladder wall thickening or pericholecystic fluid.  2. Increase in liver echogenicity, a finding indicative of hepatic steatosis. While no focal liver lesions are evident on this study, it must be cautioned that the sensitivity of ultrasound for detection of focal liver lesions is diminished in this circumstance.   Assessment    62 y/o F presenting today for ED follow up after evaluation for chest pain revealed cholelithiasis.      Plan    We discussed the natural history of gallstones, that they are very common and do not cause symptoms in the majority of patients. I'm still not entirely sure that her symptoms last week actually are entirely attributable to her stones, as much of her complaint is localized to the mid-epigastrium and mid-sternum, hence the ED evaluation for cardiovascular source.  She has an appointment with GI next week, which I think is appropriate, as she may have a hiatal hernia or other esophageal/gastric pathology.  In the case that her gallstones were, in fact, the cause of her symptoms, I did offer her the option of having her gallbladder removed versus observing and waiting to see if she has another attack. She would like to undergo cholecystectomy, however, she prefers to have this done at [facility].  We will make the referral.  No return visit scheduled.Fredirick Maudlin 06/09/2018, 1:52 PM

## 2018-06-09 NOTE — Patient Instructions (Signed)
We will send the referral to [facility]. Someone from their office will contact you to schedule an appointment.

## 2018-09-03 ENCOUNTER — Other Ambulatory Visit: Payer: Self-pay | Admitting: Physician Assistant

## 2018-09-08 ENCOUNTER — Other Ambulatory Visit (HOSPITAL_COMMUNITY): Payer: Self-pay | Admitting: Physician Assistant

## 2018-09-08 ENCOUNTER — Other Ambulatory Visit: Payer: Self-pay | Admitting: Physician Assistant

## 2018-09-08 DIAGNOSIS — H8110 Benign paroxysmal vertigo, unspecified ear: Secondary | ICD-10-CM

## 2018-09-16 ENCOUNTER — Other Ambulatory Visit: Payer: Self-pay

## 2018-09-16 ENCOUNTER — Ambulatory Visit
Admission: RE | Admit: 2018-09-16 | Discharge: 2018-09-16 | Disposition: A | Discharge status: Home / Self Care | Payer: 59 | Source: Ambulatory Visit | Attending: Physician Assistant | Admitting: Physician Assistant

## 2018-09-16 DIAGNOSIS — H8110 Benign paroxysmal vertigo, unspecified ear: Secondary | ICD-10-CM | POA: Insufficient documentation

## 2018-09-16 LAB — POCT I-STAT CREATININE: Creatinine, Ser: 0.8 mg/dL (ref 0.44–1.00)

## 2018-09-16 MED ORDER — GADOBUTROL 1 MMOL/ML IV SOLN
7.0000 mL | Freq: Once | INTRAVENOUS | Status: AC | PRN
  Administered 2018-09-16: 7 mL via INTRAVENOUS

## 2018-11-15 ENCOUNTER — Other Ambulatory Visit: Payer: Self-pay | Admitting: Physician Assistant

## 2018-11-15 DIAGNOSIS — Z1231 Encounter for screening mammogram for malignant neoplasm of breast: Secondary | ICD-10-CM

## 2018-12-28 ENCOUNTER — Ambulatory Visit
Admission: RE | Admit: 2018-12-28 | Discharge: 2018-12-28 | Disposition: A | Discharge status: Home / Self Care | Payer: 59 | Source: Ambulatory Visit | Attending: Physician Assistant | Admitting: Physician Assistant

## 2018-12-28 ENCOUNTER — Other Ambulatory Visit: Payer: Self-pay

## 2018-12-28 DIAGNOSIS — Z1231 Encounter for screening mammogram for malignant neoplasm of breast: Secondary | ICD-10-CM | POA: Insufficient documentation

## 2019-06-23 IMAGING — US US ABDOMEN LIMITED
1 series · 14 of 25 positions shown · non-contrast
Comparison: None.

CLINICAL DATA: Right upper quadrant pain

EXAM:
ULTRASOUND ABDOMEN LIMITED RIGHT UPPER QUADRANT

[Series 1: us abdomen limited · 0.20mm/px · 55 acquisitions, 14 frames shown]
[im 1/55]
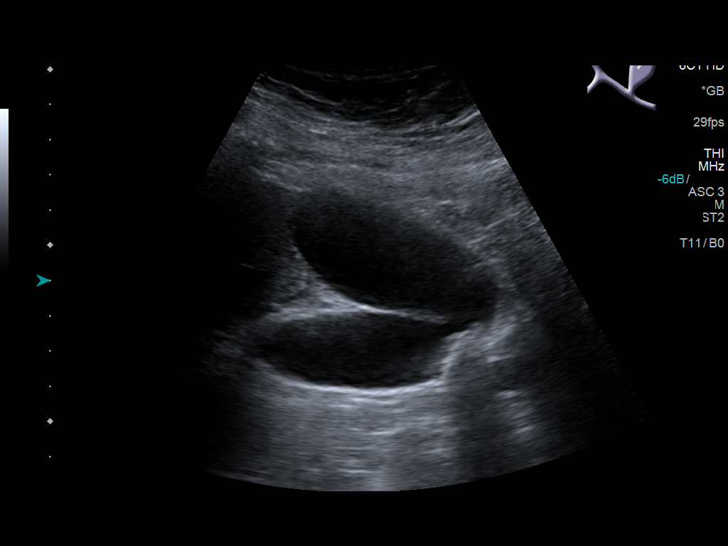
[im 5/55]
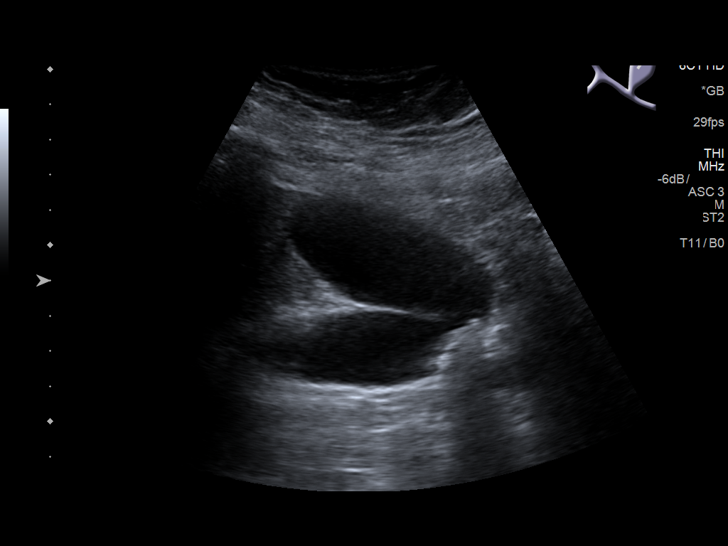
[im 10/55]
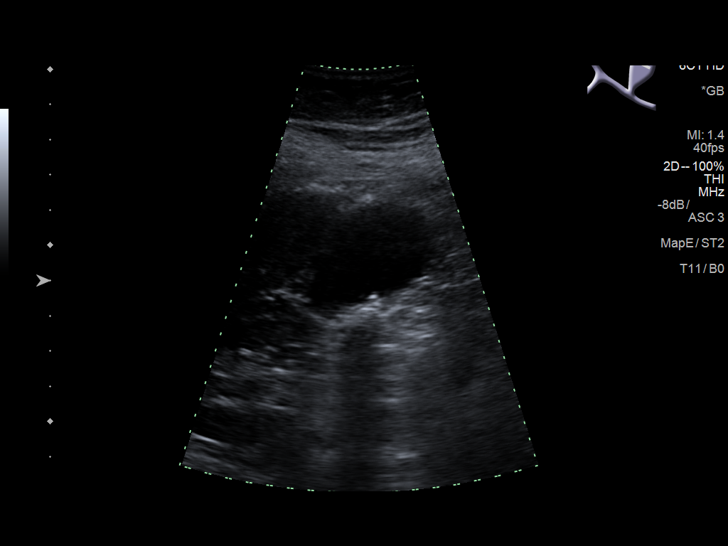
[im 14/55]
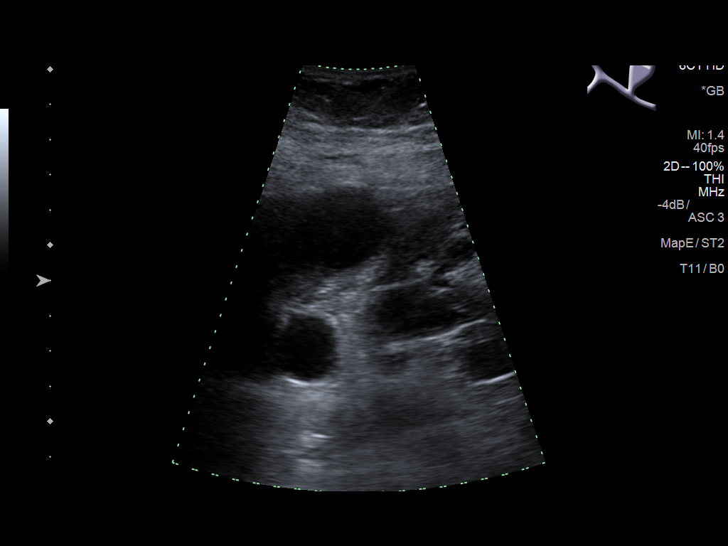
[im 19/55]
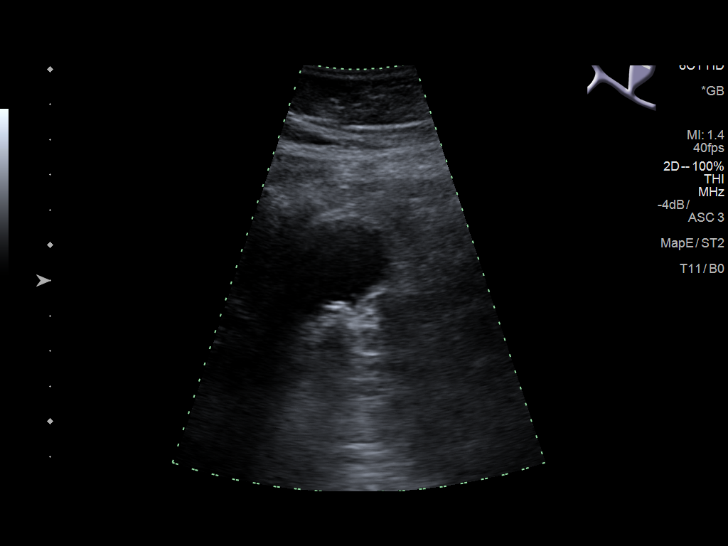
[im 21/55]
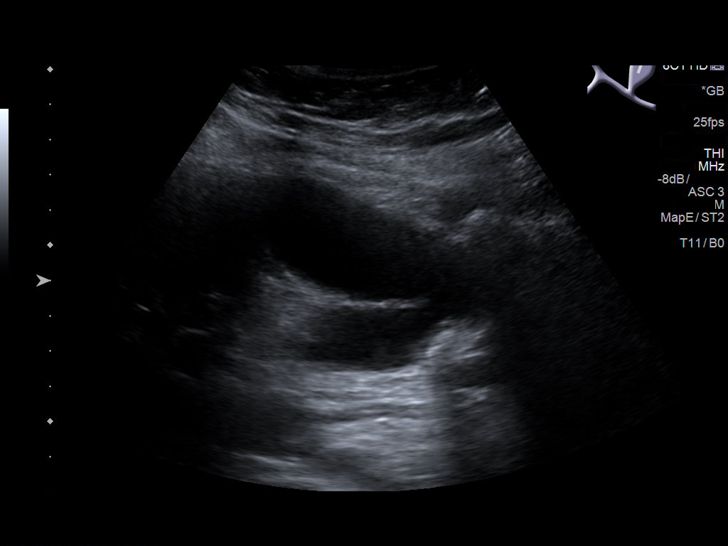
[im 25/55]
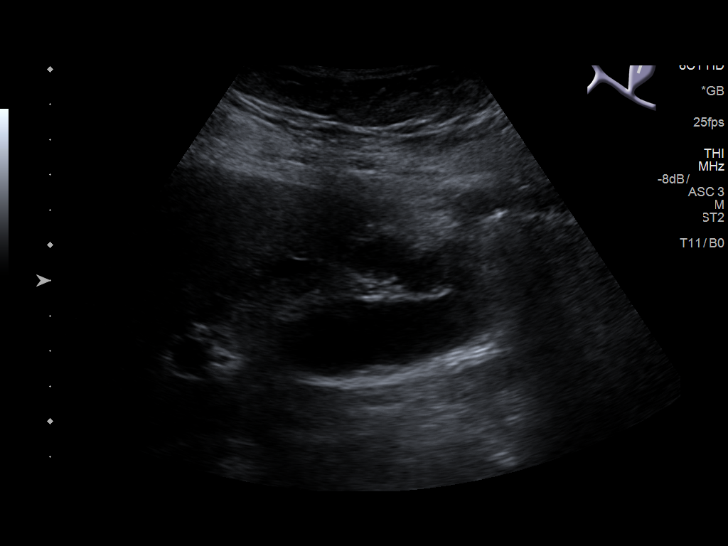
[im 30/55]
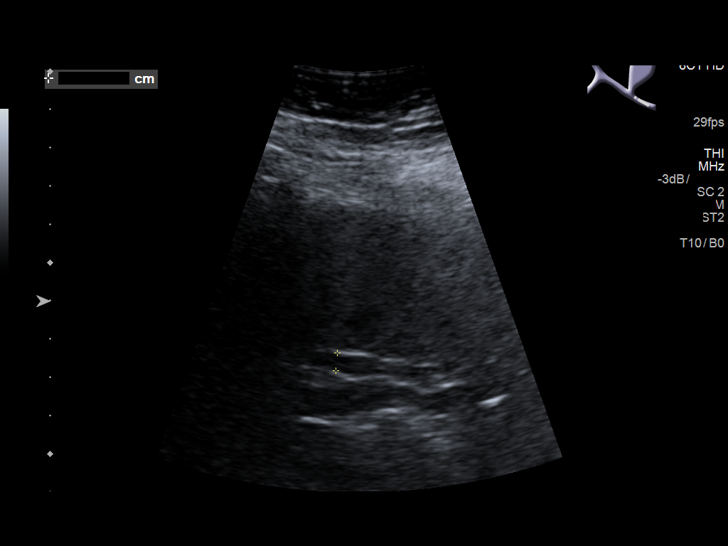
[im 34/55]
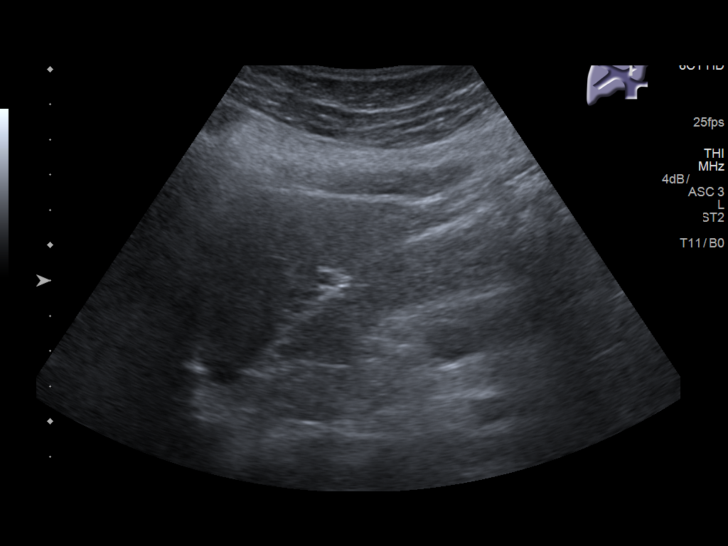
[im 37/55]
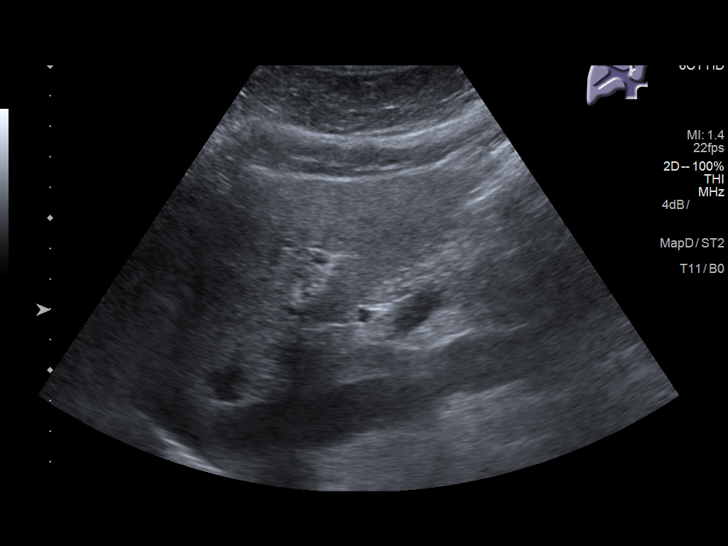
[im 41/55]
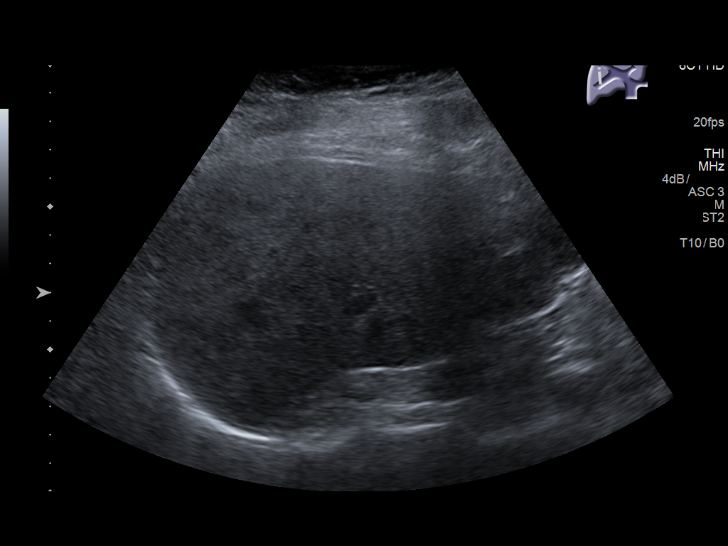
[im 46/55]
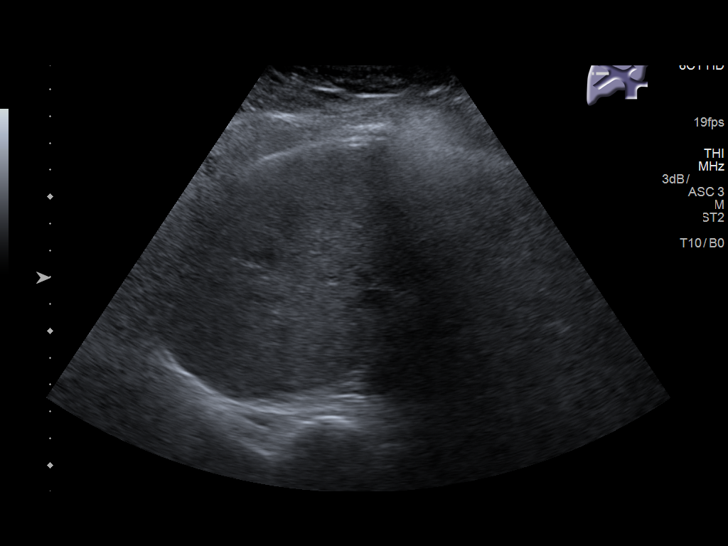
[im 50/55]
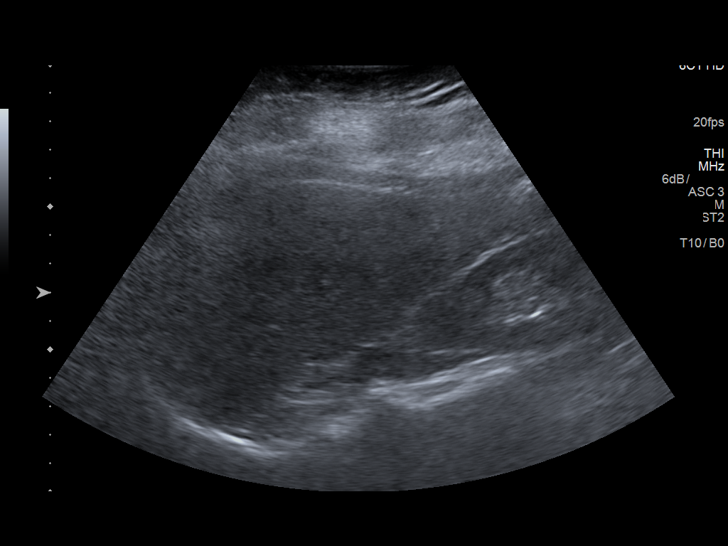
[im 55/55]
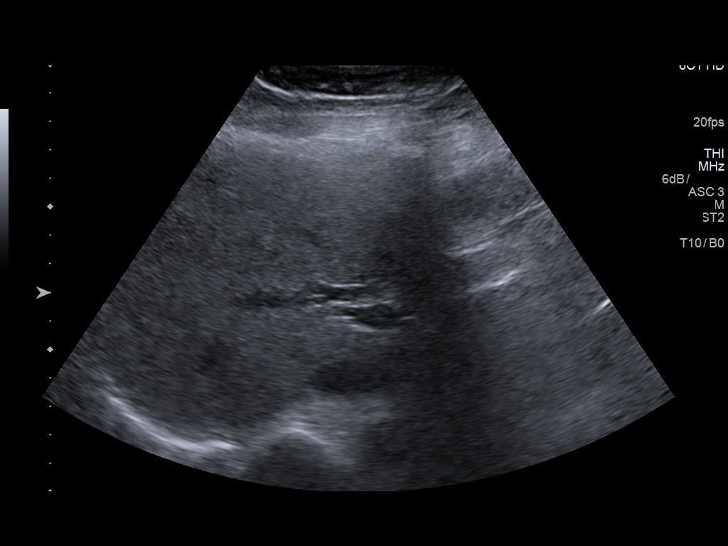

[14 of 25 positions shown; findings below may reference images not displayed]

FINDINGS: Gallbladder:

Within the gallbladder, there are echogenic foci which move and
shadow consistent with cholelithiasis. Largest gallstone measures
2.4 cm in length. There is no gallbladder wall thickening or
pericholecystic fluid. No sonographic Murphy sign noted by
sonographer.

Common bile duct:

Diameter: 5 mm. No intrahepatic or extrahepatic biliary duct
dilatation.

Liver:

No focal lesion identified. Liver echogenicity overall is increased.
Portal vein is patent on color Doppler imaging with normal direction
of blood flow towards the liver.
IMPRESSION: 1. Cholelithiasis. No gallbladder wall thickening or pericholecystic
fluid.

2. Increase in liver echogenicity, a finding indicative of hepatic
steatosis. While no focal liver lesions are evident on this study,
it must be cautioned that the sensitivity of ultrasound for
detection of focal liver lesions is diminished in this circumstance.

## 2019-11-05 IMAGING — CR DG CHEST 2V
1 series · 2 of 2 positions shown · non-contrast
Comparison: None.

CLINICAL DATA: Mid chest pain, nausea, vomiting, shortness of
breath

EXAM:
CHEST - 2 VIEW

[Series 1: w chest pa · 0.14mm/px · 2 of 2 slices shown]
[im 1/2]
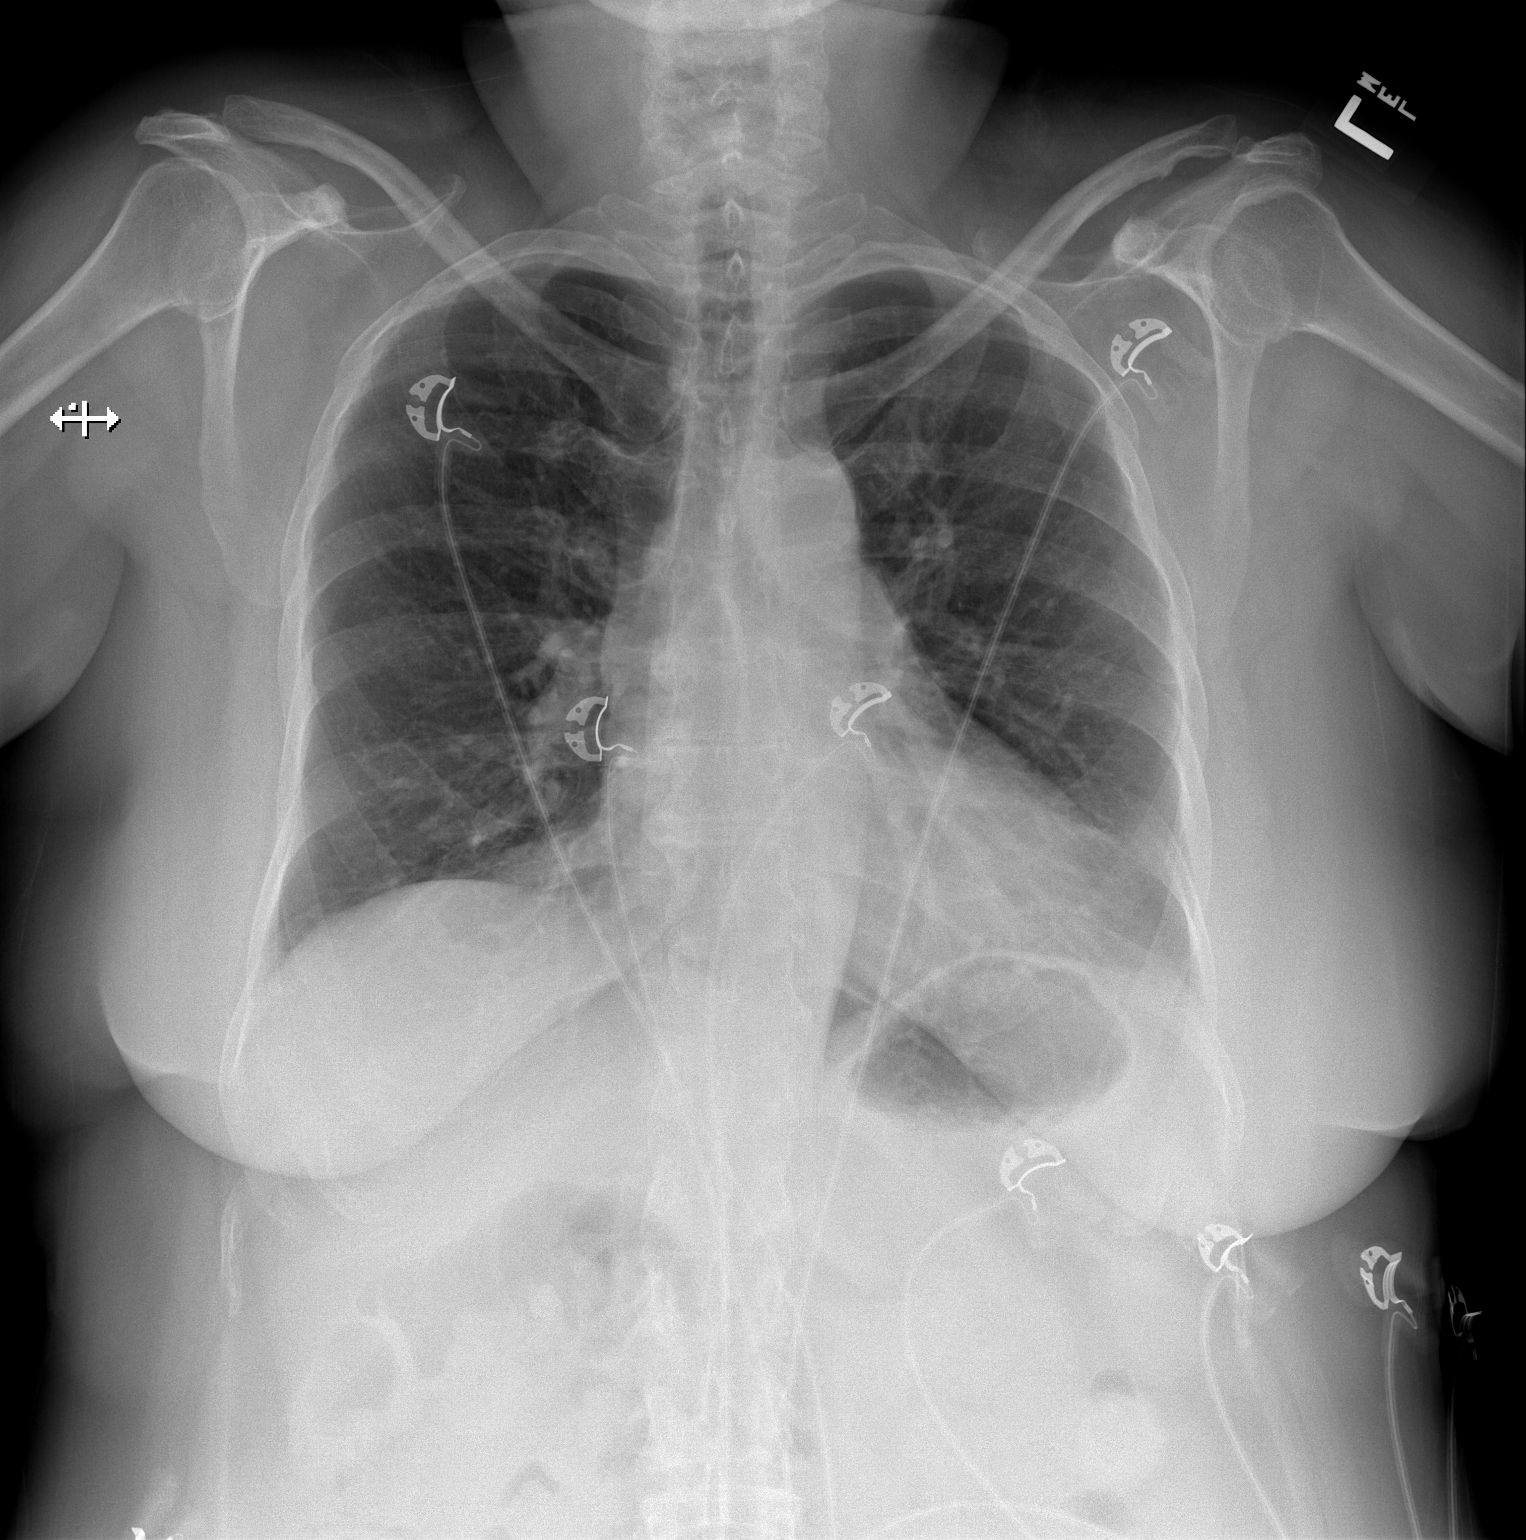
[im 2/2]
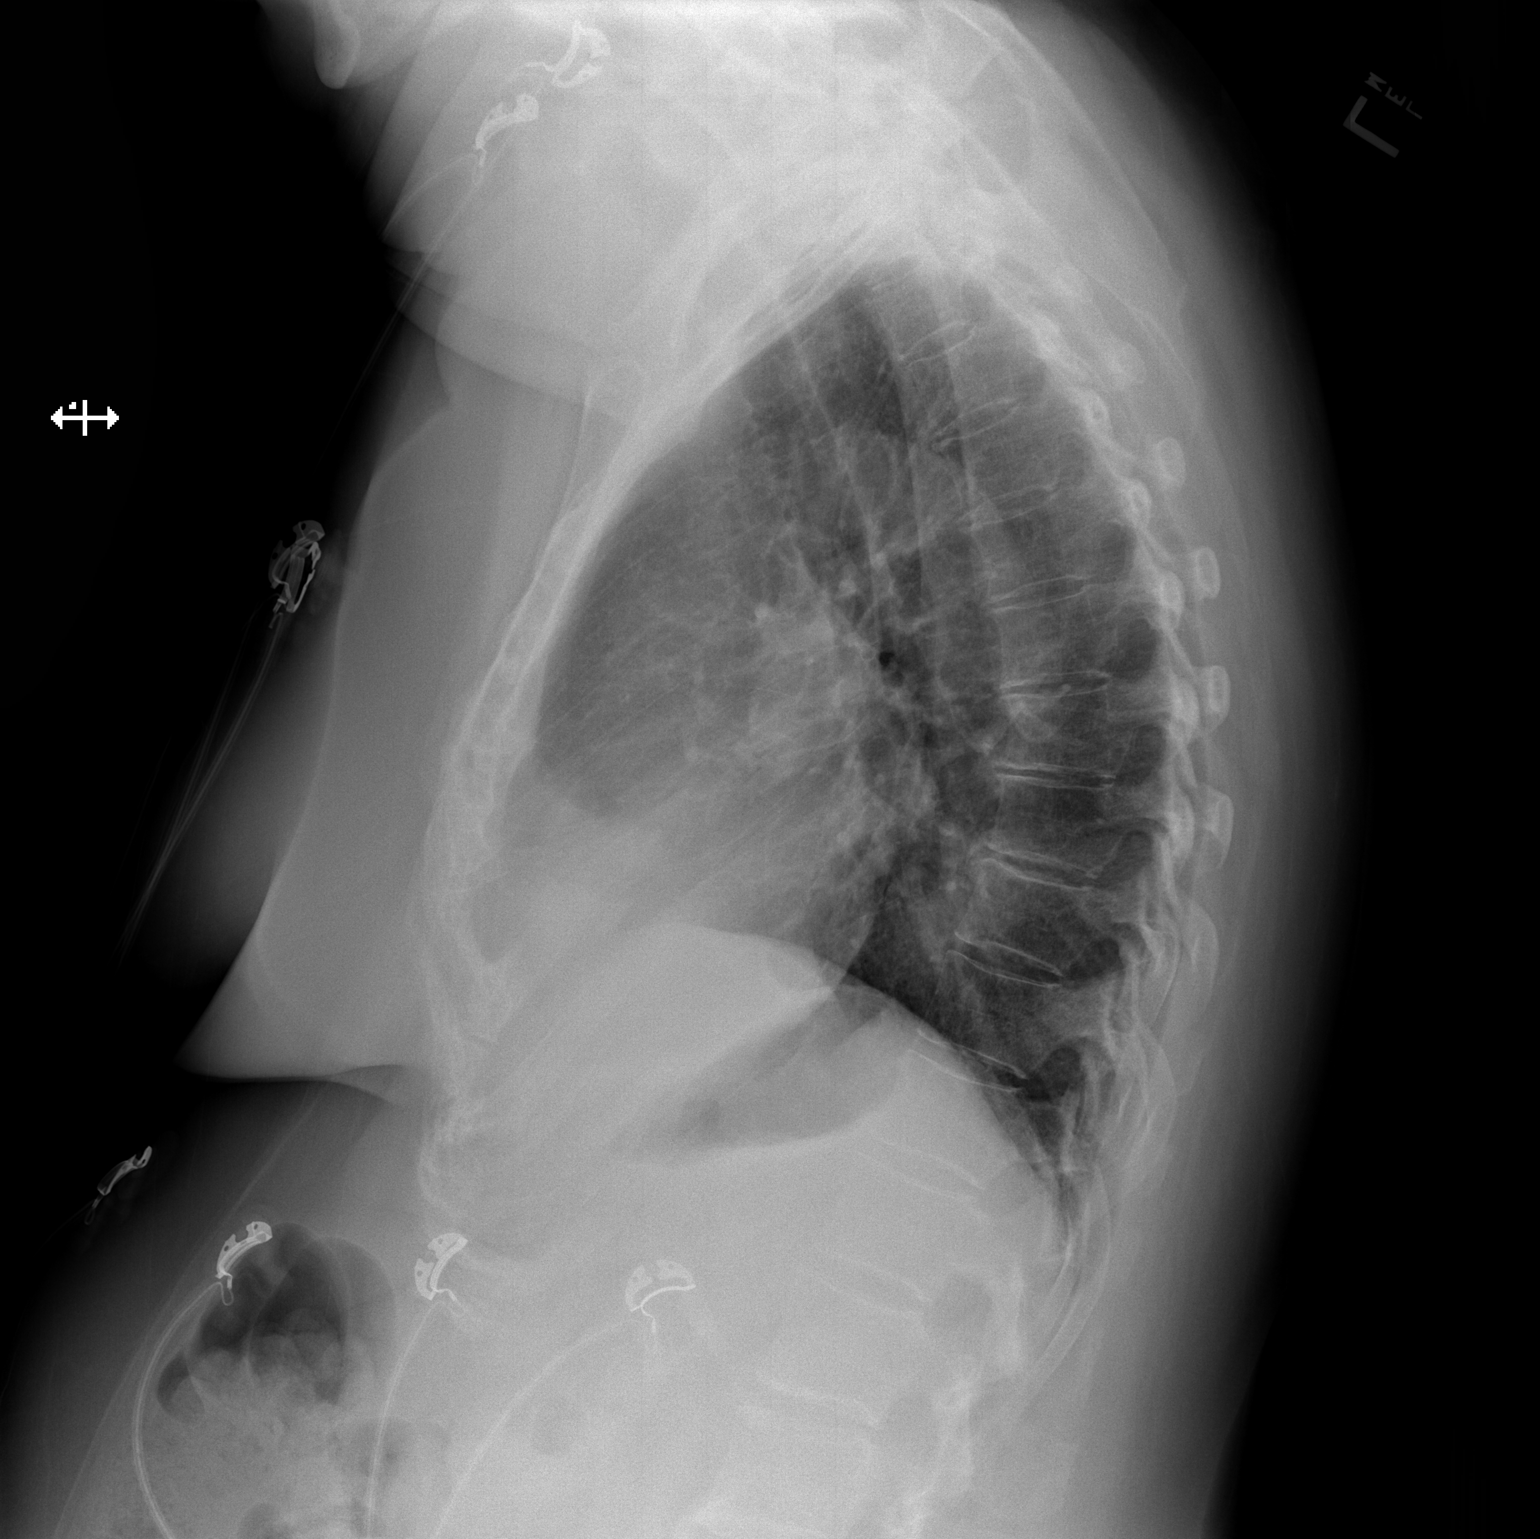

[2 of 2 positions shown; findings below may reference images not displayed]

FINDINGS: Heart and mediastinal contours are within normal limits. No focal
opacities or effusions. No acute bony abnormality.
IMPRESSION: No active cardiopulmonary disease.

## 2019-12-15 ENCOUNTER — Other Ambulatory Visit (HOSPITAL_COMMUNITY): Payer: Self-pay | Admitting: Neurology

## 2019-12-15 ENCOUNTER — Other Ambulatory Visit: Payer: Self-pay | Admitting: Neurology

## 2019-12-15 DIAGNOSIS — D329 Benign neoplasm of meninges, unspecified: Secondary | ICD-10-CM

## 2019-12-28 ENCOUNTER — Ambulatory Visit
Admission: RE | Admit: 2019-12-28 | Discharge: 2019-12-28 | Disposition: A | Discharge status: Home / Self Care | Payer: Medicare HMO | Source: Ambulatory Visit | Attending: Neurology | Admitting: Neurology

## 2019-12-28 ENCOUNTER — Other Ambulatory Visit: Payer: Self-pay

## 2019-12-28 DIAGNOSIS — D329 Benign neoplasm of meninges, unspecified: Secondary | ICD-10-CM | POA: Diagnosis present

## 2019-12-28 MED ORDER — GADOBUTROL 1 MMOL/ML IV SOLN
7.0000 mL | Freq: Once | INTRAVENOUS | Status: AC | PRN
  Administered 2019-12-28: 7 mL via INTRAVENOUS

## 2020-01-30 ENCOUNTER — Ambulatory Visit: Admit: 2020-01-30 | Payer: Medicare HMO | Admitting: Ophthalmology

## 2020-01-30 SURGERY — PHACOEMULSIFICATION, CATARACT, WITH IOL INSERTION
Anesthesia: Topical | Laterality: Left

## 2020-02-24 ENCOUNTER — Other Ambulatory Visit: Payer: Self-pay | Admitting: Physician Assistant

## 2020-02-24 DIAGNOSIS — Z1231 Encounter for screening mammogram for malignant neoplasm of breast: Secondary | ICD-10-CM

## 2020-04-10 ENCOUNTER — Other Ambulatory Visit: Payer: Self-pay

## 2020-04-10 ENCOUNTER — Ambulatory Visit
Admission: RE | Admit: 2020-04-10 | Discharge: 2020-04-10 | Disposition: A | Discharge status: Home / Self Care | Payer: Medicare HMO | Source: Ambulatory Visit | Attending: Physician Assistant | Admitting: Physician Assistant

## 2020-04-10 DIAGNOSIS — Z1231 Encounter for screening mammogram for malignant neoplasm of breast: Secondary | ICD-10-CM | POA: Diagnosis present

## 2020-05-15 DIAGNOSIS — I639 Cerebral infarction, unspecified: Secondary | ICD-10-CM

## 2020-05-15 HISTORY — DX: Cerebral infarction, unspecified: I63.9

## 2020-06-01 IMAGING — MG DIGITAL SCREENING BILATERAL MAMMOGRAM WITH TOMO AND CAD
8 series · 9 of 24 positions shown · non-contrast
Comparison: Previous exam(s).

CLINICAL DATA: Screening.

EXAM:
DIGITAL SCREENING BILATERAL MAMMOGRAM WITH TOMO AND CAD

[R CC synth-2D]
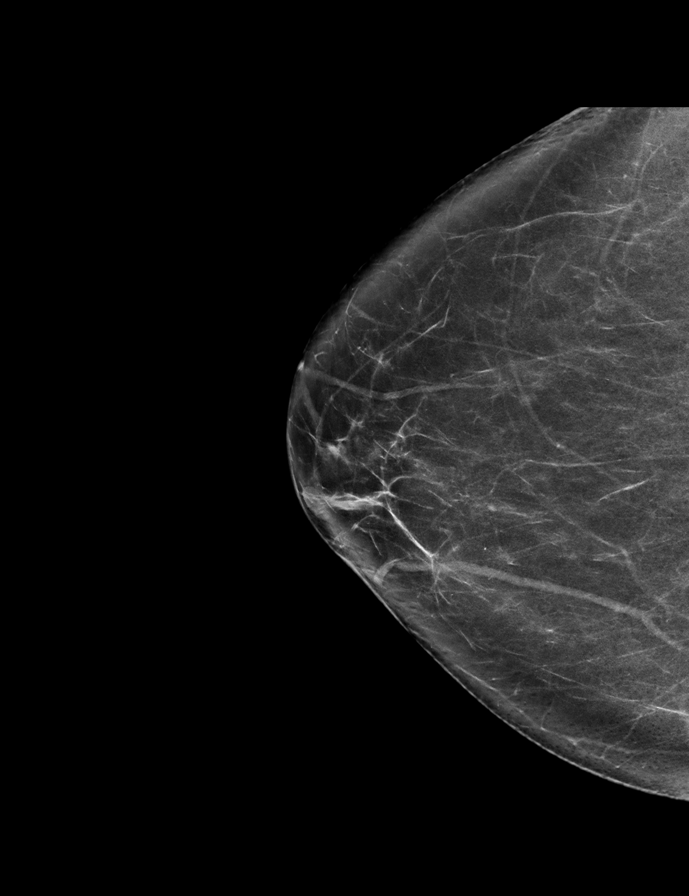

[R MLO synth-2D]
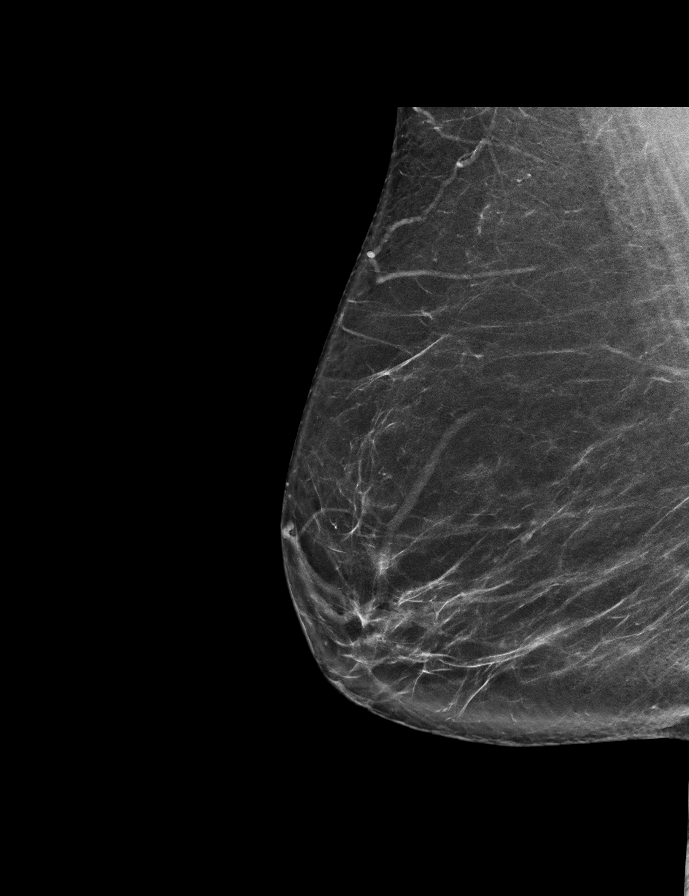

[L CC synth-2D]
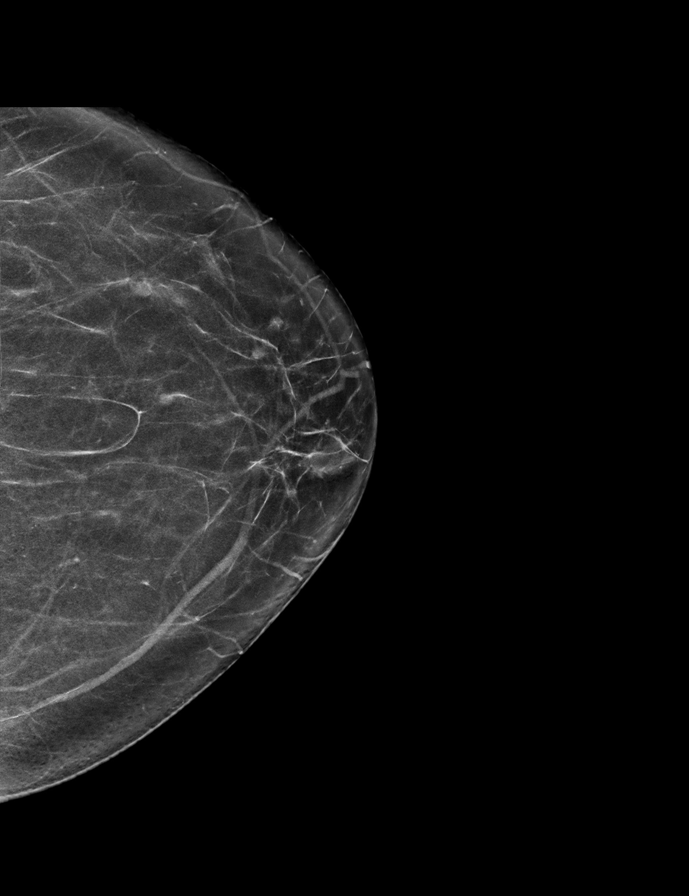

[L MLO synth-2D]
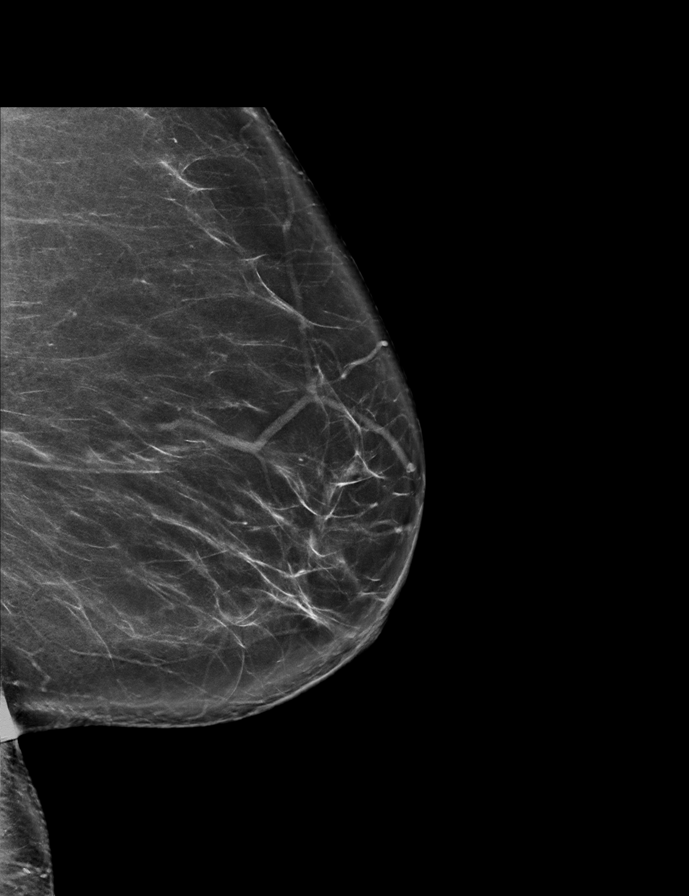

[R MLO tomo · 2 of 72 frames shown]
[frame 24/72]
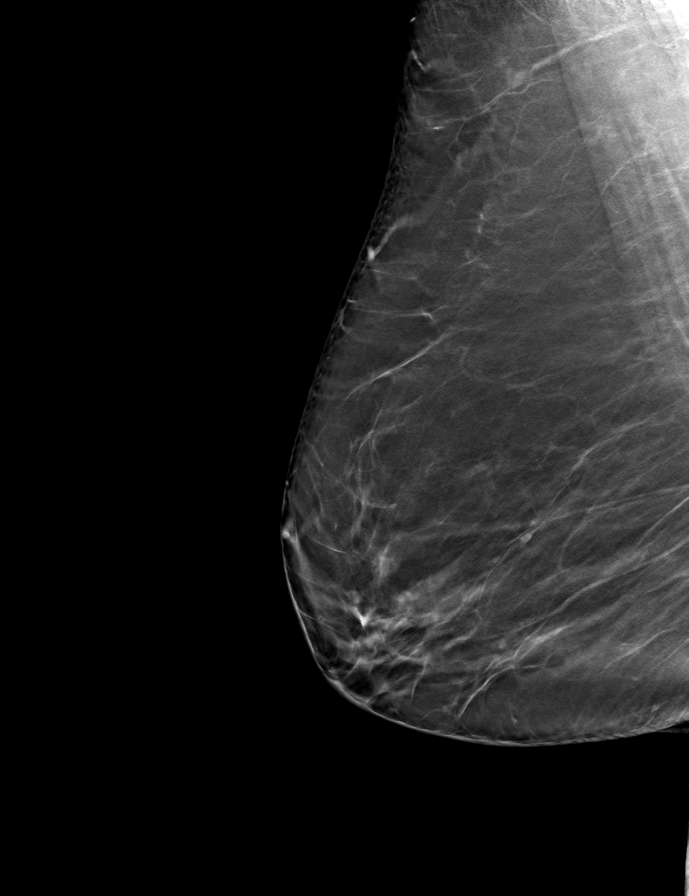
[frame 37/72]
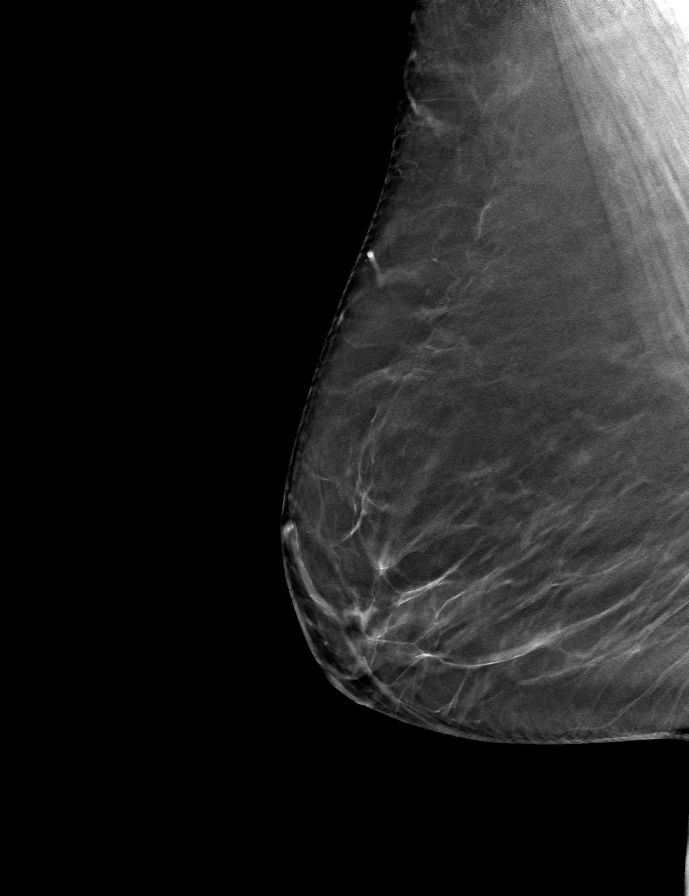

[L CC tomo · tomo slice 31/62.0]
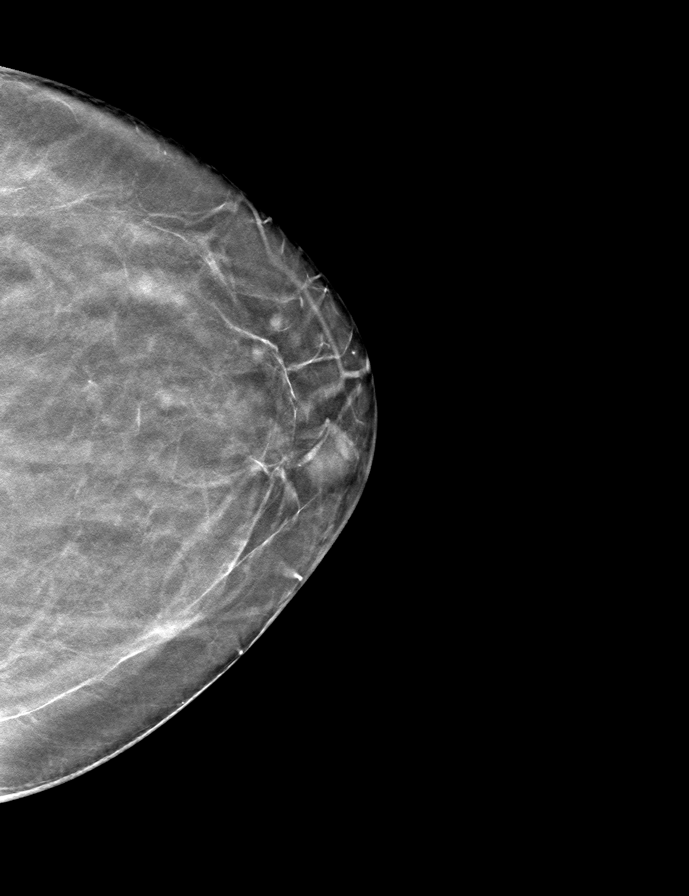

[R CC tomo · tomo slice 35/69.0]
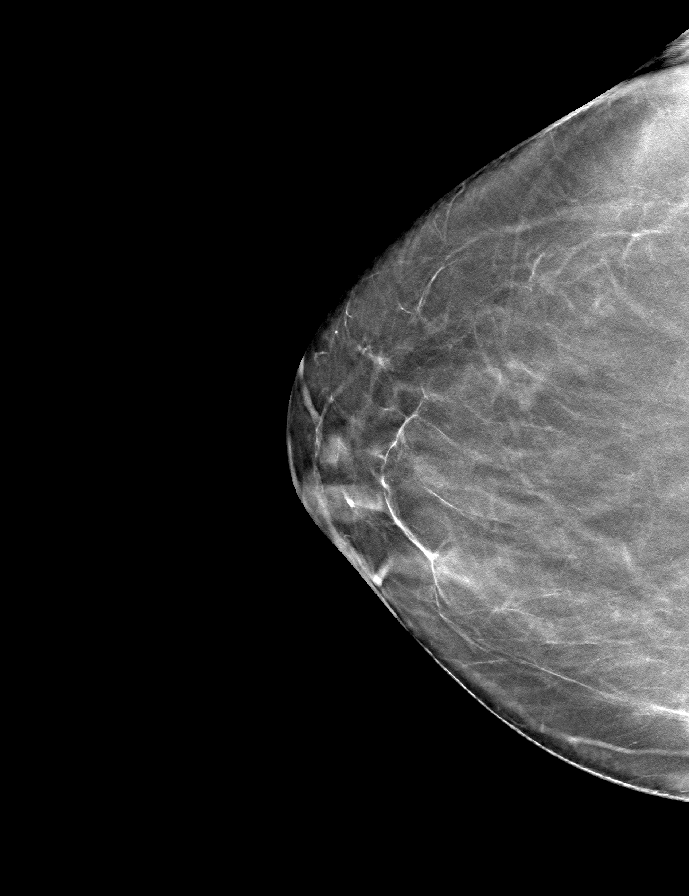

[L MLO tomo · tomo slice 39/77.0]
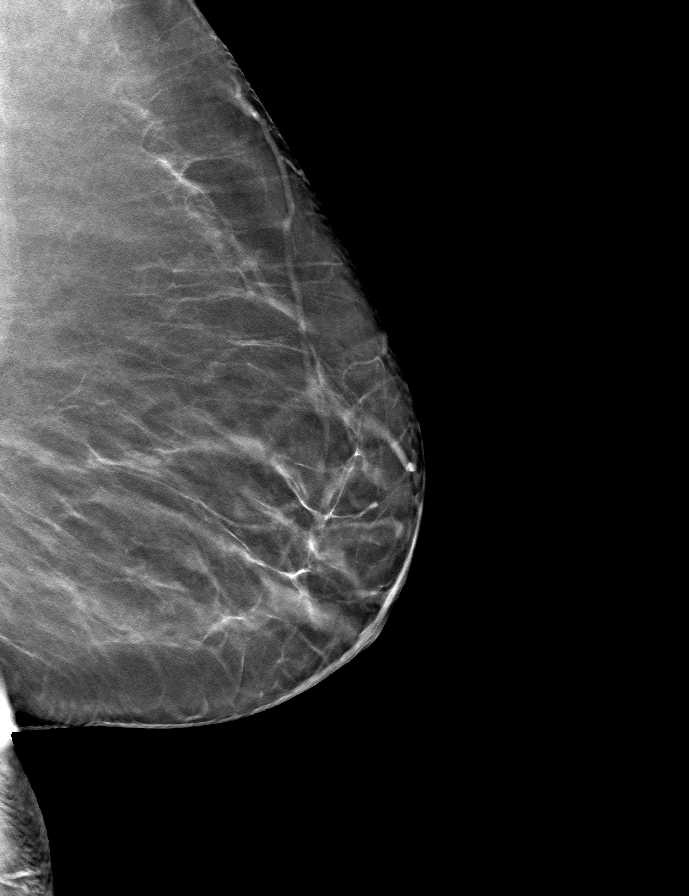

[9 of 24 positions shown; findings below may reference images not displayed]

ACR Breast Density Category b: There are scattered areas of
fibroglandular density.
FINDINGS: There are no findings suspicious for malignancy. Images were
processed with CAD.
IMPRESSION: No mammographic evidence of malignancy. A result letter of this
screening mammogram will be mailed directly to the patient.

RECOMMENDATION:
Screening mammogram in one year. (Code:CN-U-775)

BI-RADS CATEGORY  1: Negative.

## 2020-12-20 ENCOUNTER — Ambulatory Visit
Admission: EM | Admit: 2020-12-20 | Discharge: 2020-12-20 | Disposition: A | Discharge status: Home / Self Care | Payer: Medicare HMO | Attending: Sports Medicine | Admitting: Sports Medicine

## 2020-12-20 ENCOUNTER — Other Ambulatory Visit: Payer: Self-pay

## 2020-12-20 ENCOUNTER — Encounter: Payer: Self-pay | Admitting: Emergency Medicine

## 2020-12-20 DIAGNOSIS — R102 Pelvic and perineal pain: Secondary | ICD-10-CM

## 2020-12-20 DIAGNOSIS — R350 Frequency of micturition: Secondary | ICD-10-CM | POA: Diagnosis not present

## 2020-12-20 DIAGNOSIS — R300 Dysuria: Secondary | ICD-10-CM | POA: Diagnosis not present

## 2020-12-20 DIAGNOSIS — N3001 Acute cystitis with hematuria: Secondary | ICD-10-CM | POA: Diagnosis not present

## 2020-12-20 LAB — URINALYSIS, COMPLETE (UACMP) WITH MICROSCOPIC
Bilirubin Urine: NEGATIVE
Glucose, UA: NEGATIVE mg/dL
Ketones, ur: NEGATIVE mg/dL
Nitrite: NEGATIVE
Protein, ur: 100 mg/dL — AB
Specific Gravity, Urine: 1.02 (ref 1.005–1.030)
WBC, UA: >50 WBC/hpf (ref 0–5)
pH: 7 (ref 5.0–8.0)

## 2020-12-20 MED ORDER — NITROFURANTOIN MONOHYD MACRO 100 MG PO CAPS: 100.0000 mg | ORAL_CAPSULE | Freq: Two times a day (BID) | ORAL | 0 refills | Status: AC

## 2020-12-20 NOTE — ED Provider Notes (Signed)
[facility]    CSN: [id number] Arrival date & time: 12/20/20  1039      History   Chief Complaint Chief Complaint  Patient presents with   Dysuria    HPI Barbara Burch is a 65 y.o. female.   65 year old female who presents for evaluation of the above issues.  Normally gets her primary care needs met over at [facility] clinic.  She reports several days, now day 4, of dysuria, burning at the end of voiding, urinary frequency, incomplete voiding, and urinary urgency.  Some mild suprapubic tenderness.  No flank pain.  No fever shakes chills.  No nausea vomiting diarrhea.  No red flag signs or symptoms elicited on history.   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      Home Medications    Prior to Admission medications   Medication Sig Start Date End Date Taking? Authorizing Provider  aspirin 81 MG chewable tablet Chew by mouth. 05/18/20 05/18/21 Yes [provider]  atorvastatin (LIPITOR) 80 MG tablet Take by mouth. 05/18/20 05/18/21 Yes [provider]  Calcium Carbonate-Vit D-Min (CALTRATE 600+D PLUS MINERALS) 600-800 MG-UNIT TABS Take 1 tablet by mouth daily.   Yes [provider]  Cholecalciferol 50 MCG (2000 UT) CAPS Take 2,000 Units by mouth daily.   Yes [provider]  Cyanocobalamin (VITAMIN B-12) 1000 MCG SUBL Take 1,000 mcg by mouth daily.   Yes [provider]  losartan (COZAAR) 25 MG tablet  05/18/20  Yes [provider]  nitrofurantoin, macrocrystal-monohydrate, (MACROBID) 100 MG capsule Take 1 capsule (100 mg total) by mouth 2 (two) times daily. 12/20/20  Yes Verda Cumins, MD  omega-3 fish oil (MAXEPA) 1000 MG CAPS capsule Take by mouth.   Yes [provider]  ZINC SULFATE PO Take by mouth.   Yes [provider]  ondansetron (ZOFRAN ODT) 4 MG disintegrating tablet 85m ODT q4 hours prn nausea/vomit 06/02/18   YDrenda Freeze MD    Family History Family History  Problem Relation Age of Onset   Colon cancer Cousin    Breast cancer Cousin    Gallbladder disease Mother    Heart failure Mother    Cancer Maternal Grandfather     Social History Social History   Tobacco Use   Smoking status: Never   Smokeless tobacco: Never  Vaping Use   Vaping Use: Never used  Substance Use Topics   Alcohol use: Never   Drug use: Never     Allergies   Amoxicillin, Cefuroxime axetil, Codeine, Ibuprofen, Penicillins, and Sulfa antibiotics   Review of Systems Review of Systems  Constitutional:  Negative for activity change, appetite change, chills, diaphoresis, fatigue and fever.  HENT:  Negative for congestion, ear pain, postnasal drip, rhinorrhea,  sinus pressure, sinus pain, sneezing and sore throat.   Eyes:  Negative for pain.  Respiratory:  Negative for cough, chest tightness and shortness of breath.   Cardiovascular:  Negative for chest pain and palpitations.  Gastrointestinal:  Positive for abdominal pain. Negative for diarrhea, nausea and vomiting.  Genitourinary:  Positive for dysuria, frequency, hematuria and urgency. Negative for flank pain, pelvic pain, vaginal bleeding, vaginal discharge and vaginal pain.  Musculoskeletal:  Negative for back pain, myalgias and neck pain.  Skin:   Negative for color change, pallor, rash and wound.  Neurological:  Negative for dizziness, light-headedness and headaches.  All other systems reviewed and are negative.   Physical Exam Triage Vital Signs ED Triage Vitals  Enc Vitals Group     BP      Pulse      Resp      Temp      Temp src      SpO2      Weight      Height      Head Circumference      Peak Flow      Pain Score      Pain Loc      Pain Edu?      Excl. in [place]?    No data found.  Updated Vital Signs BP 136/77 (BP Location: Right Arm)   Pulse 88   Temp 98.8 F (37.1 C) (Oral)   Resp 18   Ht _0  (1.499 m)   Wt 71.3 kg   SpO2 100%   BMI 31.75 kg/m   Visual Acuity Right Eye Distance:   Left Eye Distance:   Bilateral Distance:    Right Eye Near:   Left Eye Near:    Bilateral Near:     Physical Exam Vitals and nursing note reviewed.  Constitutional:      General: She is not in acute distress.    Appearance: Normal appearance. She is not ill-appearing, toxic-appearing or diaphoretic.  HENT:     Head: Normocephalic and atraumatic.     Nose: Nose normal.     Mouth/Throat:     Mouth: Mucous membranes are moist.  Eyes:     General: No scleral icterus.    Conjunctiva/sclera: Conjunctivae normal.     Pupils: Pupils are equal, round, and reactive to light.  Cardiovascular:     Rate and Rhythm: Normal rate and regular rhythm.     Pulses: Normal pulses.     Heart sounds: Normal heart sounds. No murmur heard.   No friction rub. No gallop.  Pulmonary:     Effort: Pulmonary effort is normal.     Breath sounds: Normal breath sounds. No stridor. No wheezing, rhonchi or rales.  Abdominal:     General: Bowel sounds are normal. There are no signs of injury.     Palpations: Abdomen is soft. There is no hepatomegaly or splenomegaly.     Tenderness: There is abdominal tenderness in the suprapubic area. There is no right CVA tenderness, left CVA tenderness, guarding or rebound.  Musculoskeletal:      Cervical back: Normal range of motion and neck supple.  Skin:    General: Skin is warm and dry.     Capillary Refill: Capillary refill takes less than 2 seconds.  Neurological:     General: No focal deficit present.     Mental Status: She is alert and oriented to person, place, and time.     UC Treatments / Results  Labs (all labs ordered are listed, but only abnormal results are displayed) Labs Reviewed  URINALYSIS, COMPLETE (UACMP) WITH MICROSCOPIC - Abnormal; Notable for the following components:      Result Value   APPearance HAZY (*)    Hgb urine dipstick MODERATE (*)    Protein, ur 100 (*)    Leukocytes,Ua LARGE (*)    Bacteria, UA FEW (*)    All other components within normal limits  URINE CULTURE    EKG   Radiology No results found.  Procedures Procedures (including critical care time)  Medications Ordered in UC Medications - No data to display  Initial Impression / Assessment and Plan / UC Course  I have reviewed the triage vital signs and the nursing notes.  Pertinent labs & imaging results that were available during my care of the patient were reviewed by me and considered in my medical decision making (see chart for details).  Clinical Course as of 12/20/20 1229  Thu Dec 20, 2020  1129 Amorphous Crystal: PRESENT [KB]    Clinical Course User Index [KB] Verda Cumins, MD    Clinical impression: Day 4 of dysuria, increased urinary frequency and urgency, some mild suprapubic pressure with probable UTI.  Treatment plan: 1.  The findings and treatment plan were discussed in detail with the patient.  Patient was in agreement. 2.  Recommended getting a UA.  Urine was hazy in appearance with moderate blood, large amount of leukocytes, greater than 50 WBCs with associated bacteria.  There was amorphous crystals present as well.  We will treat accordingly.  We will send off the culture. 3.  Sent in an antibiotic, Macrobid to her pharmacy. 4.  Educational  handouts provided. 5.  Plenty of rest, plenty of fluids to flush her system with plenty of water.  She can supplement that with Azo and cranberry juice. 6.  If symptoms persist she is to see her primary care provider.  If they worsen she should go to ER. 7.  She has a follow-up with her primary care provider in a couple weeks and I told her that they may recheck her urine to ensure that she is completely clear this infection. 8.  Follow-up here as needed.  She was stable on discharge.   Final Clinical Impressions(s) / UC Diagnoses   Final diagnoses:  Acute cystitis with hematuria  Dysuria  Urinary frequency  Suprapubic abdominal pain     Discharge Instructions      As we discussed, your urine does look like you have a infection.  We will treat you with antibiotic.  Please flush your system with plenty of water and cranberry juice.  You can use over-the-counter Azo if you want. Please see educational handouts. I sent your urine off for culture, someone may contact you and change your antibiotic.  Given your multiple allergies to medications I think the one we chose should be fine. Please follow-up with your primary care provider.  You may need a repeat urine after you have completed the antibiotic to ensure that the infection is cleared. If your symptoms worsen, you develop fever, back pain, or worsening symptoms then please go to the emergency room.     ED Prescriptions     Medication Sig Dispense Auth. Provider   nitrofurantoin, macrocrystal-monohydrate, (MACROBID) 100 MG capsule Take 1 capsule (100 mg total) by mouth 2 (two) times daily. 10 capsule Verda Cumins, MD      PDMP not reviewed this encounter.   Verda Cumins, MD  12/20/20 1229

## 2020-12-20 NOTE — ED Triage Notes (Signed)
Pt c/o dysuria, urinary retention, lower abdominal pain, lower back pain. Started about 4 days ago. Denies fevers.

## 2020-12-20 NOTE — Discharge Instructions (Addendum)
As we discussed, your urine does look like you have a infection.  We will treat you with antibiotic.  Please flush your system with plenty of water and cranberry juice.  You can use over-the-counter Azo if you want. Please see educational handouts. I sent your urine off for culture, someone may contact you and change your antibiotic.  Given your multiple allergies to medications I think the one we chose should be fine. Please follow-up with your primary care provider.  You may need a repeat urine after you have completed the antibiotic to ensure that the infection is cleared. If your symptoms worsen, you develop fever, back pain, or worsening symptoms then please go to the emergency room.

## 2020-12-22 LAB — URINE CULTURE: Culture: >=100000 — AB

## 2021-01-01 ENCOUNTER — Other Ambulatory Visit: Payer: Self-pay | Admitting: Physician Assistant

## 2021-01-01 DIAGNOSIS — Z1231 Encounter for screening mammogram for malignant neoplasm of breast: Secondary | ICD-10-CM

## 2021-04-11 ENCOUNTER — Ambulatory Visit
Admission: RE | Admit: 2021-04-11 | Discharge: 2021-04-11 | Disposition: A | Discharge status: Home / Self Care | Payer: Medicare HMO | Source: Ambulatory Visit | Attending: Physician Assistant | Admitting: Physician Assistant

## 2021-04-11 ENCOUNTER — Other Ambulatory Visit: Payer: Self-pay

## 2021-04-11 DIAGNOSIS — Z1231 Encounter for screening mammogram for malignant neoplasm of breast: Secondary | ICD-10-CM | POA: Insufficient documentation

## 2021-04-23 ENCOUNTER — Encounter: Payer: Self-pay | Admitting: General Surgery

## 2021-06-12 ENCOUNTER — Other Ambulatory Visit: Payer: Self-pay | Admitting: Neurology

## 2021-06-12 DIAGNOSIS — D329 Benign neoplasm of meninges, unspecified: Secondary | ICD-10-CM

## 2021-06-22 ENCOUNTER — Other Ambulatory Visit: Payer: Medicare HMO

## 2021-06-25 ENCOUNTER — Ambulatory Visit
Admission: RE | Admit: 2021-06-25 | Discharge: 2021-06-25 | Disposition: A | Discharge status: Home / Self Care | Payer: Medicare HMO | Source: Ambulatory Visit | Attending: Neurology | Admitting: Neurology

## 2021-06-25 DIAGNOSIS — D329 Benign neoplasm of meninges, unspecified: Secondary | ICD-10-CM | POA: Diagnosis present

## 2021-06-25 MED ORDER — GADOBUTROL 1 MMOL/ML IV SOLN
7.0000 mL | Freq: Once | INTRAVENOUS | Status: AC | PRN
  Administered 2021-06-25: 16:00:00 7 mL via INTRAVENOUS

## 2021-07-22 ENCOUNTER — Encounter: Payer: Self-pay | Admitting: Ophthalmology

## 2021-08-02 NOTE — Discharge Instructions (Signed)

## 2021-08-06 ENCOUNTER — Ambulatory Visit: Payer: Medicare HMO | Admitting: Anesthesiology

## 2021-08-06 ENCOUNTER — Ambulatory Visit
Admission: RE | Admit: 2021-08-06 | Discharge: 2021-08-06 | Disposition: A | Discharge status: Home / Self Care | Payer: Medicare HMO | Source: Ambulatory Visit | Attending: Ophthalmology | Admitting: Ophthalmology

## 2021-08-06 ENCOUNTER — Other Ambulatory Visit: Payer: Self-pay

## 2021-08-06 ENCOUNTER — Encounter: Payer: Self-pay | Admitting: Ophthalmology

## 2021-08-06 ENCOUNTER — Encounter
Admission: RE | Disposition: A | Discharge status: Home / Self Care | Payer: Self-pay | Source: Ambulatory Visit | Attending: Ophthalmology

## 2021-08-06 DIAGNOSIS — Z6833 Body mass index (BMI) 33.0-33.9, adult: Secondary | ICD-10-CM | POA: Insufficient documentation

## 2021-08-06 DIAGNOSIS — Z8669 Personal history of other diseases of the nervous system and sense organs: Secondary | ICD-10-CM | POA: Diagnosis not present

## 2021-08-06 DIAGNOSIS — I69328 Other speech and language deficits following cerebral infarction: Secondary | ICD-10-CM | POA: Insufficient documentation

## 2021-08-06 DIAGNOSIS — H2512 Age-related nuclear cataract, left eye: Secondary | ICD-10-CM | POA: Diagnosis present

## 2021-08-06 DIAGNOSIS — E669 Obesity, unspecified: Secondary | ICD-10-CM | POA: Diagnosis not present

## 2021-08-06 DIAGNOSIS — I10 Essential (primary) hypertension: Secondary | ICD-10-CM | POA: Insufficient documentation

## 2021-08-06 HISTORY — DX: Benign neoplasm of brain, unspecified: D33.2

## 2021-08-06 HISTORY — DX: Anemia, unspecified: D64.9

## 2021-08-06 HISTORY — PX: CATARACT EXTRACTION W/PHACO: SHX586

## 2021-08-06 HISTORY — DX: Personal history of other diseases of the circulatory system: Z86.79

## 2021-08-06 SURGERY — PHACOEMULSIFICATION, CATARACT, WITH IOL INSERTION
Anesthesia: Monitor Anesthesia Care | Site: Eye | Laterality: Left

## 2021-08-06 MED ORDER — SIGHTPATH DOSE#1 BSS IO SOLN
INTRAOCULAR | Status: DC | PRN
  Administered 2021-08-06: 15 mL

## 2021-08-06 MED ORDER — MIDAZOLAM HCL 2 MG/2ML IJ SOLN
INTRAMUSCULAR | Status: DC | PRN
Start: 2021-08-06 — End: 2021-08-06
  Administered 2021-08-06: 2 mg via INTRAVENOUS

## 2021-08-06 MED ORDER — MOXIFLOXACIN HCL 0.5 % OP SOLN
OPHTHALMIC | Status: DC | PRN
  Administered 2021-08-06: 0.2 mL via OPHTHALMIC

## 2021-08-06 MED ORDER — SIGHTPATH DOSE#1 BSS IO SOLN
INTRAOCULAR | Status: DC | PRN
  Administered 2021-08-06: 45 mL via OPHTHALMIC

## 2021-08-06 MED ORDER — SIGHTPATH DOSE#1 NA CHONDROIT SULF-NA HYALURON 40-17 MG/ML IO SOLN
INTRAOCULAR | Status: DC | PRN
  Administered 2021-08-06: 1 mL via INTRAOCULAR

## 2021-08-06 MED ORDER — ACETAMINOPHEN 325 MG PO TABS: 325.0000 mg | ORAL_TABLET | ORAL | Status: DC | PRN

## 2021-08-06 MED ORDER — TETRACAINE HCL 0.5 % OP SOLN
1.0000 [drp] | OPHTHALMIC | Status: DC | PRN
  Administered 2021-08-06 (×3): 1 [drp] via OPHTHALMIC

## 2021-08-06 MED ORDER — FENTANYL CITRATE (PF) 100 MCG/2ML IJ SOLN
INTRAMUSCULAR | Status: DC | PRN
  Administered 2021-08-06: 50 ug via INTRAVENOUS

## 2021-08-06 MED ORDER — ACETAMINOPHEN 160 MG/5ML PO SOLN: 325.0000 mg | ORAL | Status: DC | PRN

## 2021-08-06 MED ORDER — ARMC OPHTHALMIC DILATING DROPS
1.0000 "application " | OPHTHALMIC | Status: DC | PRN
  Administered 2021-08-06 (×3): 1 via OPHTHALMIC

## 2021-08-06 MED ORDER — BRIMONIDINE TARTRATE-TIMOLOL 0.2-0.5 % OP SOLN
OPHTHALMIC | Status: DC | PRN
  Administered 2021-08-06: 1 [drp] via OPHTHALMIC

## 2021-08-06 MED ORDER — SIGHTPATH DOSE#1 BSS IO SOLN
INTRAOCULAR | Status: DC | PRN
  Administered 2021-08-06: 1 mL via INTRAMUSCULAR

## 2021-08-06 MED ORDER — ONDANSETRON HCL 4 MG/2ML IJ SOLN: 4.0000 mg | Freq: Once | INTRAMUSCULAR | Status: DC | PRN

## 2021-08-06 SURGICAL SUPPLY — 10 items
CATARACT SUITE SIGHTPATH (MISCELLANEOUS) ×2 IMPLANT
FEE CATARACT SUITE SIGHTPATH (MISCELLANEOUS) ×1 IMPLANT
GLOVE SURG ENC TEXT LTX SZ8 (GLOVE) ×2 IMPLANT
GLOVE SURG TRIUMPH 8.0 PF LTX (GLOVE) ×2 IMPLANT
LENS IOL TECNIS EYHANCE 23.5 (Intraocular Lens) ×1 IMPLANT
NDL FILTER BLUNT 18X1 1/2 (NEEDLE) ×1 IMPLANT
NEEDLE FILTER BLUNT 18X 1/2SAF (NEEDLE) ×1
NEEDLE FILTER BLUNT 18X1 1/2 (NEEDLE) ×1 IMPLANT
SYR 3ML LL SCALE MARK (SYRINGE) ×2 IMPLANT
WATER STERILE IRR 250ML POUR (IV SOLUTION) ×2 IMPLANT

## 2021-08-06 NOTE — Anesthesia Procedure Notes (Signed)
Procedure Name: MAC Date/Time: 08/06/2021 9:21 AM Performed by: Jeannene Patella, CRNA Pre-anesthesia Checklist: Patient identified, Emergency Drugs available, Suction available, Timeout performed and Patient being monitored Patient Re-evaluated:Patient Re-evaluated prior to induction Oxygen Delivery Method: Nasal cannula Placement Confirmation: positive ETCO2

## 2021-08-06 NOTE — H&P (Signed)
[place]   Primary Care Physician:  Marinda Elk, MD Ophthalmologist: Dr. George Ina  Pre-Procedure History & Physical: HPI:  Barbara Burch is a 66 y.o. female here for cataract surgery.   Past Medical History:  Diagnosis Date   Anemia    Brain tumor (benign) ([place])    History of rheumatic fever    Hypertension    Osteoporosis    Seizures ([place])    has not had a seizure in 54yrs   Stroke ([place]) 05/15/2020   occas. speech problems and balance issues    Past Surgical History:  Procedure Laterality Date   CESAREAN SECTION     fooft  Right    SHOULDER SURGERY Left     Prior to Admission medications   Medication Sig Start Date End Date Taking? Authorizing Provider  atorvastatin (LIPITOR) 80 MG tablet Take by mouth. 05/18/20 08/06/21 Yes [provider]  Calcium Carbonate-Vit D-Min (CALTRATE 600+D PLUS MINERALS) 600-800 MG-UNIT TABS Take 1 tablet by mouth daily.   Yes [provider]  Cholecalciferol 50 MCG (2000 UT) CAPS Take 2,000 Units by mouth daily.   Yes [provider]  Cyanocobalamin (VITAMIN B-12) 1000 MCG SUBL Take 1,000 mcg by mouth daily.   Yes [provider]  losartan (COZAAR) 25 MG tablet  05/18/20  Yes [provider]  omega-3 fish oil (MAXEPA) 1000 MG CAPS capsule Take by mouth.   Yes [provider]  ondansetron (ZOFRAN ODT) 4 MG disintegrating tablet 4mg  ODT q4 hours prn nausea/vomit 06/02/18  Yes Drenda Freeze, MD  ZINC SULFATE PO Take by mouth.   Yes [provider]  nitrofurantoin, macrocrystal-monohydrate, (MACROBID) 100 MG capsule Take 1 capsule (100 mg total) by mouth 2 (two) times daily. Patient not taking: Reported on 07/22/2021 12/20/20   Verda Cumins, MD    Allergies as of 07/22/2021 - Review Complete 07/22/2021  Allergen Reaction Noted   Amoxicillin Nausea Only and Other (See Comments) 01/17/2018   Cefuroxime axetil Other (See Comments) 12/27/2013   Codeine Other (See  Comments) 12/27/2013   Ibuprofen Other (See Comments) 12/27/2013   Penicillins Nausea And Vomiting and Nausea Only 12/27/2013   Sulfa antibiotics Nausea Only 12/27/2013    Family History  Problem Relation Age of Onset   Colon cancer Cousin    Breast cancer Cousin    Gallbladder disease Mother    Heart failure Mother    Cancer Maternal Grandfather     Social History   Socioeconomic History   Marital status: Married    Spouse name: Not on file   Number of children: Not on file   Years of education: Not on file   Highest education level: Not on file  Occupational History   Not on file  Tobacco Use   Smoking status: Never   Smokeless tobacco: Never  Vaping Use   Vaping Use: Never used  Substance and Sexual Activity   Alcohol use: Never   Drug use: Never   Sexual activity: Not on file  Other Topics Concern   Not on file  Social History Narrative   Not on file   Social Determinants of Health   Financial Resource Strain: Not on file  Food Insecurity: Not on file  Transportation Needs: Not on file  Physical Activity: Not on file  Stress: Not on file  Social Connections: Not on file  Intimate Partner Violence: Not on file    Review of Systems: See HPI, otherwise negative ROS  Physical Exam:  BP (!) 153/73    Pulse 71    Temp 97.7 F (36.5 C) (Temporal)    Resp 16    Ht 4\' 10"  (1.473 m)    Wt 71.7 kg    SpO2 98%    BMI 33.02 kg/m  General:   Alert, cooperative in NAD Head:  Normocephalic and atraumatic. Respiratory:  Normal work of breathing. Cardiovascular:  RRR  Impression/Plan: Barbara Burch is here for cataract surgery.  Risks, benefits, limitations, and alternatives regarding cataract surgery have been reviewed with the patient.  Questions have been answered.  All parties agreeable.   Birder Robson, MD  08/06/2021, 9:13 AM

## 2021-08-06 NOTE — Anesthesia Preprocedure Evaluation (Addendum)
Anesthesia Evaluation  Patient identified by MRN, date of birth, ID band Patient awake    Reviewed: Allergy & Precautions, NPO status   Airway Mallampati: II  TM Distance: >3 FB     Dental   Pulmonary neg pulmonary ROS,    Pulmonary exam normal        Cardiovascular hypertension,  Rhythm:Regular Rate:Normal     Neuro/Psych  Headaches, Seizures - (none in 20 years),  CVA (2021 - residual speech and balance issues )    GI/Hepatic negative GI ROS,   Endo/Other  Obesity - BMI 33  Renal/GU      Musculoskeletal   Abdominal   Peds  Hematology   Anesthesia Other Findings   Reproductive/Obstetrics                            Anesthesia Physical Anesthesia Plan  ASA: 3  Anesthesia Plan: MAC   Post-op Pain Management: Minimal or no pain anticipated   Induction: Intravenous  PONV Risk Score and Plan: TIVA, Midazolam and Treatment may vary due to age or medical condition  Airway Management Planned: Natural Airway and Nasal Cannula  Additional Equipment:   Intra-op Plan:   Post-operative Plan:   Informed Consent: I have reviewed the patients History and Physical, chart, labs and discussed the procedure including the risks, benefits and alternatives for the proposed anesthesia with the patient or authorized representative who has indicated his/her understanding and acceptance.       Plan Discussed with: CRNA  Anesthesia Plan Comments:        Anesthesia Quick Evaluation

## 2021-08-06 NOTE — Anesthesia Postprocedure Evaluation (Signed)
Anesthesia Post Note  Patient: Barbara Burch  Procedure(s) Performed: CATARACT EXTRACTION PHACO AND INTRAOCULAR LENS PLACEMENT (IOC) LEFT 5.62 00:36.0 (Left: Eye)     Patient location during evaluation: PACU Anesthesia Type: MAC Level of consciousness: awake Pain management: pain level controlled Vital Signs Assessment: post-procedure vital signs reviewed and stable Respiratory status: respiratory function stable Cardiovascular status: stable Postop Assessment: no apparent nausea or vomiting Anesthetic complications: no   No notable events documented.  Veda Canning

## 2021-08-06 NOTE — Op Note (Signed)
PREOPERATIVE DIAGNOSIS:  Nuclear sclerotic cataract of the left eye.   POSTOPERATIVE DIAGNOSIS:  Nuclear sclerotic cataract of the left eye.   OPERATIVE PROCEDURE:ORPROCALL@   SURGEON:  Barbara Robson, MD.   ANESTHESIA:  Anesthesiologist: Veda Canning, MD CRNA: Jeannene Patella, CRNA  1.      Managed anesthesia care. 2.     0.73ml of Shugarcaine was instilled following the paracentesis   COMPLICATIONS:  None.   TECHNIQUE:   Stop and chop   DESCRIPTION OF PROCEDURE:  The patient was examined and consented in the preoperative holding area where the aforementioned topical anesthesia was applied to the left eye and then brought back to the Operating Room where the left eye was prepped and draped in the usual sterile ophthalmic fashion and a lid speculum was placed. A paracentesis was created with the side port blade and the anterior chamber was filled with viscoelastic. A near clear corneal incision was performed with the steel keratome. A continuous curvilinear capsulorrhexis was performed with a cystotome followed by the capsulorrhexis forceps. Hydrodissection and hydrodelineation were carried out with BSS on a blunt cannula. The lens was removed in a stop and chop  technique and the remaining cortical material was removed with the irrigation-aspiration handpiece. The capsular bag was inflated with viscoelastic and the Technis [id number] lens was placed in the capsular bag without complication. The remaining viscoelastic was removed from the eye with the irrigation-aspiration handpiece. The wounds were hydrated. The anterior chamber was flushed with BSS and the eye was inflated to physiologic pressure. 0.31ml Vigamox was placed in the anterior chamber. The wounds were found to be water tight. The eye was dressed with Combigan. The patient was given protective glasses to wear throughout the day and a shield with which to sleep tonight. The patient was also given drops with which to begin a drop  regimen today and will follow-up with me in one day. Implant Name Type Inv. Item Serial No. Manufacturer Lot No. LRB No. Used Action  LENS IOL TECNIS EYHANCE 23.5 - [id number] Intraocular Lens LENS IOL TECNIS EYHANCE 23.5 [id number] [employer]  Left 1 Implanted    Procedure(s): CATARACT EXTRACTION PHACO AND INTRAOCULAR LENS PLACEMENT (IOC) LEFT 5.62 00:36.0 (Left)  Electronically signed: Birder Burch 08/06/2021 9:34 AM

## 2021-08-06 NOTE — Transfer of Care (Signed)
Immediate Anesthesia Transfer of Care Note  Patient: Barbara Burch  Procedure(s) Performed: CATARACT EXTRACTION PHACO AND INTRAOCULAR LENS PLACEMENT (IOC) LEFT 5.62 00:36.0 (Left: Eye)  Patient Location: PACU  Anesthesia Type: MAC  Level of Consciousness: awake, alert  and patient cooperative  Airway and Oxygen Therapy: Patient Spontanous Breathing and Patient connected to supplemental oxygen  Post-op Assessment: Post-op Vital signs reviewed, Patient's Cardiovascular Status Stable, Respiratory Function Stable, Patent Airway and No signs of Nausea or vomiting  Post-op Vital Signs: Reviewed and stable  Complications: No notable events documented.

## 2021-08-07 ENCOUNTER — Encounter: Payer: Self-pay | Admitting: Ophthalmology

## 2021-08-15 NOTE — Discharge Instructions (Signed)

## 2021-08-20 ENCOUNTER — Other Ambulatory Visit: Payer: Self-pay

## 2021-08-20 ENCOUNTER — Encounter
Admission: RE | Disposition: A | Discharge status: Home / Self Care | Payer: Self-pay | Source: Home / Self Care | Attending: Ophthalmology

## 2021-08-20 ENCOUNTER — Ambulatory Visit
Admission: RE | Admit: 2021-08-20 | Discharge: 2021-08-20 | Disposition: A | Discharge status: Home / Self Care | Payer: Medicare HMO | Attending: Ophthalmology | Admitting: Ophthalmology

## 2021-08-20 ENCOUNTER — Ambulatory Visit: Payer: Medicare HMO | Admitting: Anesthesiology

## 2021-08-20 ENCOUNTER — Encounter: Payer: Self-pay | Admitting: Ophthalmology

## 2021-08-20 DIAGNOSIS — E669 Obesity, unspecified: Secondary | ICD-10-CM | POA: Diagnosis not present

## 2021-08-20 DIAGNOSIS — H2511 Age-related nuclear cataract, right eye: Secondary | ICD-10-CM | POA: Insufficient documentation

## 2021-08-20 DIAGNOSIS — Z6833 Body mass index (BMI) 33.0-33.9, adult: Secondary | ICD-10-CM | POA: Insufficient documentation

## 2021-08-20 DIAGNOSIS — Z8673 Personal history of transient ischemic attack (TIA), and cerebral infarction without residual deficits: Secondary | ICD-10-CM | POA: Diagnosis not present

## 2021-08-20 DIAGNOSIS — I10 Essential (primary) hypertension: Secondary | ICD-10-CM | POA: Diagnosis not present

## 2021-08-20 HISTORY — PX: CATARACT EXTRACTION W/PHACO: SHX586

## 2021-08-20 SURGERY — PHACOEMULSIFICATION, CATARACT, WITH IOL INSERTION
Anesthesia: Monitor Anesthesia Care | Site: Eye | Laterality: Right

## 2021-08-20 MED ORDER — SIGHTPATH DOSE#1 NA CHONDROIT SULF-NA HYALURON 40-17 MG/ML IO SOLN
INTRAOCULAR | Status: DC | PRN
  Administered 2021-08-20: 1 mL via INTRAOCULAR

## 2021-08-20 MED ORDER — SIGHTPATH DOSE#1 BSS IO SOLN
INTRAOCULAR | Status: DC | PRN
  Administered 2021-08-20: 15 mL

## 2021-08-20 MED ORDER — SIGHTPATH DOSE#1 BSS IO SOLN
INTRAOCULAR | Status: DC | PRN
  Administered 2021-08-20: 1 mL via INTRAMUSCULAR

## 2021-08-20 MED ORDER — MIDAZOLAM HCL 2 MG/2ML IJ SOLN
INTRAMUSCULAR | Status: DC | PRN
  Administered 2021-08-20: 1 mg via INTRAVENOUS

## 2021-08-20 MED ORDER — BRIMONIDINE TARTRATE-TIMOLOL 0.2-0.5 % OP SOLN
OPHTHALMIC | Status: DC | PRN
  Administered 2021-08-20: 1 [drp] via OPHTHALMIC

## 2021-08-20 MED ORDER — LACTATED RINGERS IV SOLN: INTRAVENOUS | Status: DC

## 2021-08-20 MED ORDER — FENTANYL CITRATE (PF) 100 MCG/2ML IJ SOLN
INTRAMUSCULAR | Status: DC | PRN
  Administered 2021-08-20: 50 ug via INTRAVENOUS

## 2021-08-20 MED ORDER — TETRACAINE HCL 0.5 % OP SOLN
1.0000 [drp] | OPHTHALMIC | Status: DC | PRN
  Administered 2021-08-20 (×3): 1 [drp] via OPHTHALMIC

## 2021-08-20 MED ORDER — ARMC OPHTHALMIC DILATING DROPS
1.0000 "application " | OPHTHALMIC | Status: DC | PRN
  Administered 2021-08-20 (×3): 1 via OPHTHALMIC

## 2021-08-20 MED ORDER — MOXIFLOXACIN HCL 0.5 % OP SOLN
OPHTHALMIC | Status: DC | PRN
  Administered 2021-08-20: 0.2 mL via OPHTHALMIC

## 2021-08-20 MED ORDER — SIGHTPATH DOSE#1 BSS IO SOLN
INTRAOCULAR | Status: DC | PRN
  Administered 2021-08-20: 73 mL via OPHTHALMIC

## 2021-08-20 SURGICAL SUPPLY — 10 items
CATARACT SUITE SIGHTPATH (MISCELLANEOUS) ×2 IMPLANT
FEE CATARACT SUITE SIGHTPATH (MISCELLANEOUS) ×1 IMPLANT
GLOVE SURG ENC TEXT LTX SZ8 (GLOVE) ×2 IMPLANT
GLOVE SURG TRIUMPH 8.0 PF LTX (GLOVE) ×2 IMPLANT
LENS IOL TECNIS EYHANCE 23.5 (Intraocular Lens) ×1 IMPLANT
NDL FILTER BLUNT 18X1 1/2 (NEEDLE) ×1 IMPLANT
NEEDLE FILTER BLUNT 18X 1/2SAF (NEEDLE) ×1
NEEDLE FILTER BLUNT 18X1 1/2 (NEEDLE) ×1 IMPLANT
SYR 3ML LL SCALE MARK (SYRINGE) ×2 IMPLANT
WATER STERILE IRR 250ML POUR (IV SOLUTION) ×2 IMPLANT

## 2021-08-20 NOTE — Anesthesia Postprocedure Evaluation (Signed)
Anesthesia Post Note  Patient: Barbara Burch  Procedure(s) Performed: CATARACT EXTRACTION PHACO AND INTRAOCULAR LENS PLACEMENT (IOC) RIGHT 4.99 00:34.7 (Right: Eye)     Patient location during evaluation: PACU Anesthesia Type: MAC Level of consciousness: awake and alert Pain management: pain level controlled Vital Signs Assessment: post-procedure vital signs reviewed and stable Respiratory status: spontaneous breathing and nonlabored ventilation Cardiovascular status: blood pressure returned to baseline Postop Assessment: no apparent nausea or vomiting Anesthetic complications: no   No notable events documented.  Janice Seales [employer]

## 2021-08-20 NOTE — Anesthesia Preprocedure Evaluation (Signed)
Anesthesia Evaluation  Patient identified by MRN, date of birth, ID band Patient awake    Reviewed: Allergy & Precautions, NPO status , Patient's Chart, lab work & pertinent test results  Airway Mallampati: II  TM Distance: >3 FB Neck ROM: Full    Dental no notable dental hx.    Pulmonary neg pulmonary ROS,    Pulmonary exam normal        Cardiovascular hypertension,  Rhythm:Regular Rate:Normal     Neuro/Psych  Headaches, Seizures - (none in 20 years),  CVA (2021 - residual speech and balance issues ) negative psych ROS   GI/Hepatic negative GI ROS, Neg liver ROS,   Endo/Other  Obesity - BMI 33  Renal/GU negative Renal ROS     Musculoskeletal negative musculoskeletal ROS (+)   Abdominal (+) + obese,   Peds  Hematology negative hematology ROS (+)   Anesthesia Other Findings   Reproductive/Obstetrics                             Anesthesia Physical  Anesthesia Plan  ASA: 3  Anesthesia Plan: MAC   Post-op Pain Management: Minimal or no pain anticipated   Induction: Intravenous  PONV Risk Score and Plan: 2 and TIVA, Midazolam and Treatment may vary due to age or medical condition  Airway Management Planned: Natural Airway and Nasal Cannula  Additional Equipment: None  Intra-op Plan:   Post-operative Plan:   Informed Consent: I have reviewed the patients History and Physical, chart, labs and discussed the procedure including the risks, benefits and alternatives for the proposed anesthesia with the patient or authorized representative who has indicated his/her understanding and acceptance.     Dental advisory given  Plan Discussed with: CRNA  Anesthesia Plan Comments:         Anesthesia Quick Evaluation

## 2021-08-20 NOTE — H&P (Signed)
[place]   Primary Care Physician:  Marinda Elk, MD Ophthalmologist: Dr. George Ina  Pre-Procedure History & Physical: HPI:  Barbara Burch is a 66 y.o. female here for cataract surgery.   Past Medical History:  Diagnosis Date   Anemia    Brain tumor (benign) ([place])    History of rheumatic fever    Hypertension    Osteoporosis    Seizures ([place])    has not had a seizure in 51yrs   Stroke ([place]) 05/15/2020   occas. speech problems and balance issues    Past Surgical History:  Procedure Laterality Date   CATARACT EXTRACTION W/PHACO Left 08/06/2021   Procedure: CATARACT EXTRACTION PHACO AND INTRAOCULAR LENS PLACEMENT (IOC) LEFT 5.62 00:36.0;  Surgeon: Birder Robson, MD;  Location: [facility];  Service: Ophthalmology;  Laterality: Left;   CESAREAN SECTION     fooft  Right    SHOULDER SURGERY Left     Prior to Admission medications   Medication Sig Start Date End Date Taking? Authorizing Provider  atorvastatin (LIPITOR) 80 MG tablet Take by mouth. 05/18/20 08/20/21 Yes [provider]  Calcium Carbonate-Vit D-Min (CALTRATE 600+D PLUS MINERALS) 600-800 MG-UNIT TABS Take 1 tablet by mouth daily.   Yes [provider]  Cholecalciferol 50 MCG (2000 UT) CAPS Take 2,000 Units by mouth daily.   Yes [provider]  Cyanocobalamin (VITAMIN B-12) 1000 MCG SUBL Take 1,000 mcg by mouth daily.   Yes [provider]  losartan (COZAAR) 25 MG tablet  05/18/20  Yes [provider]  omega-3 fish oil (MAXEPA) 1000 MG CAPS capsule Take by mouth.   Yes [provider]  ondansetron (ZOFRAN ODT) 4 MG disintegrating tablet 4mg  ODT q4 hours prn nausea/vomit 06/02/18  Yes Drenda Freeze, MD  ZINC SULFATE PO Take by mouth.   Yes [provider]  nitrofurantoin, macrocrystal-monohydrate, (MACROBID) 100 MG capsule Take 1 capsule (100 mg total) by mouth 2 (two) times daily. Patient not taking: Reported on 07/22/2021  12/20/20   Verda Cumins, MD    Allergies as of 07/22/2021 - Review Complete 07/22/2021  Allergen Reaction Noted   Amoxicillin Nausea Only and Other (See Comments) 01/17/2018   Cefuroxime axetil Other (See Comments) 12/27/2013   Codeine Other (See Comments) 12/27/2013   Ibuprofen Other (See Comments) 12/27/2013   Penicillins Nausea And Vomiting and Nausea Only 12/27/2013   Sulfa antibiotics Nausea Only 12/27/2013    Family History  Problem Relation Age of Onset   Colon cancer Cousin    Breast cancer Cousin    Gallbladder disease Mother    Heart failure Mother    Cancer Maternal Grandfather     Social History   Socioeconomic History   Marital status: Married    Spouse name: Not on file   Number of children: Not on file   Years of education: Not on file   Highest education level: Not on file  Occupational History   Not on file  Tobacco Use   Smoking status: Never   Smokeless tobacco: Never  Vaping Use   Vaping Use: Never used  Substance and Sexual Activity   Alcohol use: Never   Drug use: Never   Sexual activity: Not on file  Other Topics Concern   Not on file  Social History Narrative   Not on file   Social Determinants of Health   Financial Resource Strain: Not on file  Food Insecurity: Not on file  Transportation Needs: Not on file  Physical Activity: Not on file  Stress: Not on file  Social Connections: Not on file  Intimate Partner Violence: Not on file    Review of Systems: See HPI, otherwise negative ROS  Physical Exam: There were no vitals taken for this visit. General:   Alert, cooperative in NAD Head:  Normocephalic and atraumatic. Respiratory:  Normal work of breathing. Cardiovascular:  RRR  Impression/Plan: Barbara Burch is here for cataract surgery.  Risks, benefits, limitations, and alternatives regarding cataract surgery have been reviewed with the patient.  Questions have been answered.  All parties agreeable.   Birder Robson,  MD  08/20/2021, 9:21 AM

## 2021-08-20 NOTE — Transfer of Care (Signed)
Immediate Anesthesia Transfer of Care Note  Patient: Barbara Burch  Procedure(s) Performed: CATARACT EXTRACTION PHACO AND INTRAOCULAR LENS PLACEMENT (IOC) RIGHT 4.99 00:34.7 (Right: Eye)  Patient Location: PACU  Anesthesia Type: MAC  Level of Consciousness: awake, alert  and patient cooperative  Airway and Oxygen Therapy: Patient Spontanous Breathing and Patient connected to supplemental oxygen  Post-op Assessment: Post-op Vital signs reviewed, Patient's Cardiovascular Status Stable, Respiratory Function Stable, Patent Airway and No signs of Nausea or vomiting  Post-op Vital Signs: Reviewed and stable  Complications: No notable events documented.

## 2021-08-20 NOTE — Op Note (Signed)
PREOPERATIVE DIAGNOSIS:  Nuclear sclerotic cataract of the right eye.   POSTOPERATIVE DIAGNOSIS:  H25.11 Cataract   OPERATIVE PROCEDURE:ORPROCALL@   SURGEON:  Birder Robson, MD.   ANESTHESIA:  Anesthesiologist: Ardeth Sportsman, MD CRNA: Cameron Ali, CRNA  1.      Managed anesthesia care. 2.      0.15ml of Shugarcaine was instilled in the eye following the paracentesis.   COMPLICATIONS:  None.   TECHNIQUE:   Stop and chop   DESCRIPTION OF PROCEDURE:  The patient was examined and consented in the preoperative holding area where the aforementioned topical anesthesia was applied to the right eye and then brought back to the Operating Room where the right eye was prepped and draped in the usual sterile ophthalmic fashion and a lid speculum was placed. A paracentesis was created with the side port blade and the anterior chamber was filled with viscoelastic. A near clear corneal incision was performed with the steel keratome. A continuous curvilinear capsulorrhexis was performed with a cystotome followed by the capsulorrhexis forceps. Hydrodissection and hydrodelineation were carried out with BSS on a blunt cannula. The lens was removed in a stop and chop  technique and the remaining cortical material was removed with the irrigation-aspiration handpiece. The capsular bag was inflated with viscoelastic and the Technis [id number]  lens was placed in the capsular bag without complication. The remaining viscoelastic was removed from the eye with the irrigation-aspiration handpiece. The wounds were hydrated. The anterior chamber was flushed with BSS and the eye was inflated to physiologic pressure. 0.60ml of Vigamox was placed in the anterior chamber. The wounds were found to be water tight. The eye was dressed with Combigan. The patient was given protective glasses to wear throughout the day and a shield with which to sleep tonight. The patient was also given drops with which to begin a drop regimen today and  will follow-up with me in one day. Implant Name Type Inv. Item Serial No. Manufacturer Lot No. LRB No. Used Action  LENS IOL TECNIS EYHANCE 23.5 - [id number] Intraocular Lens LENS IOL TECNIS EYHANCE 23.5 [id number] [employer]  Right 1 Implanted   Procedure(s): CATARACT EXTRACTION PHACO AND INTRAOCULAR LENS PLACEMENT (IOC) RIGHT 4.99 00:34.7 (Right)  Electronically signed: Birder Robson 08/20/2021 9:49 AM

## 2021-08-21 ENCOUNTER — Encounter: Payer: Self-pay | Admitting: Ophthalmology

## 2022-01-15 ENCOUNTER — Other Ambulatory Visit: Payer: Self-pay | Admitting: Physician Assistant

## 2022-01-15 DIAGNOSIS — Z1231 Encounter for screening mammogram for malignant neoplasm of breast: Secondary | ICD-10-CM

## 2022-04-14 ENCOUNTER — Ambulatory Visit
Admission: RE | Admit: 2022-04-14 | Discharge: 2022-04-14 | Disposition: A | Discharge status: Home / Self Care | Payer: Medicare HMO | Source: Ambulatory Visit | Attending: Physician Assistant | Admitting: Physician Assistant

## 2022-04-14 DIAGNOSIS — Z1231 Encounter for screening mammogram for malignant neoplasm of breast: Secondary | ICD-10-CM | POA: Diagnosis present

## 2022-05-02 ENCOUNTER — Other Ambulatory Visit: Payer: Self-pay

## 2022-05-02 ENCOUNTER — Emergency Department
Admission: EM | Admit: 2022-05-02 | Discharge: 2022-05-02 | Disposition: A | Discharge status: Home / Self Care | Payer: PRIVATE HEALTH INSURANCE | Attending: Emergency Medicine | Admitting: Emergency Medicine

## 2022-05-02 DIAGNOSIS — U071 COVID-19: Secondary | ICD-10-CM | POA: Insufficient documentation

## 2022-05-02 DIAGNOSIS — R509 Fever, unspecified: Secondary | ICD-10-CM | POA: Diagnosis present

## 2022-05-02 LAB — CBC
HCT: 41.2 % (ref 36.0–46.0)
Hemoglobin: 12.6 g/dL (ref 12.0–15.0)
MCH: 25.4 pg — ABNORMAL LOW (ref 26.0–34.0)
MCHC: 30.6 g/dL (ref 30.0–36.0)
MCV: 82.9 fL (ref 80.0–100.0)
Platelets: 123 10*3/uL — ABNORMAL LOW (ref 150–400)
RBC: 4.97 MIL/uL (ref 3.87–5.11)
RDW: 14.5 % (ref 11.5–15.5)
WBC: 4.4 10*3/uL (ref 4.0–10.5)
nRBC: 0 % (ref 0.0–0.2)

## 2022-05-02 LAB — RESP PANEL BY RT-PCR (FLU A&B, COVID) ARPGX2
Influenza A by PCR: NEGATIVE
Influenza B by PCR: NEGATIVE
SARS Coronavirus 2 by RT PCR: POSITIVE — AB

## 2022-05-02 LAB — URINALYSIS, ROUTINE W REFLEX MICROSCOPIC
Bacteria, UA: NONE SEEN
Bilirubin Urine: NEGATIVE
Glucose, UA: NEGATIVE mg/dL
Ketones, ur: NEGATIVE mg/dL
Leukocytes,Ua: NEGATIVE
Nitrite: NEGATIVE
Protein, ur: NEGATIVE mg/dL
Specific Gravity, Urine: 1.01 (ref 1.005–1.030)
pH: 7 (ref 5.0–8.0)

## 2022-05-02 LAB — BASIC METABOLIC PANEL
Anion gap: 8 (ref 5–15)
BUN: 7 mg/dL — ABNORMAL LOW (ref 8–23)
CO2: 28 mmol/L (ref 22–32)
Calcium: 8.8 mg/dL — ABNORMAL LOW (ref 8.9–10.3)
Chloride: 107 mmol/L (ref 98–111)
Creatinine, Ser: 0.69 mg/dL (ref 0.44–1.00)
GFR, Estimated: >60 mL/min (ref >60)
Glucose, Bld: 99 mg/dL (ref 70–99)
Potassium: 3.5 mmol/L (ref 3.5–5.1)
Sodium: 143 mmol/L (ref 135–145)

## 2022-05-02 LAB — MAGNESIUM: Magnesium: 2 mg/dL (ref 1.7–2.4)

## 2022-05-02 MED ORDER — NIRMATRELVIR/RITONAVIR (PAXLOVID)TABLET: 3.0000 | ORAL_TABLET | Freq: Two times a day (BID) | ORAL | 0 refills | Status: DC

## 2022-05-02 MED ORDER — GABAPENTIN 300 MG PO CAPS
300.0000 mg | ORAL_CAPSULE | Freq: Once | ORAL | Status: AC
  Administered 2022-05-02: 300 mg via ORAL
  Filled 2022-05-02: qty 1

## 2022-05-02 MED ORDER — ONDANSETRON HCL 4 MG PO TABS: 4.0000 mg | ORAL_TABLET | Freq: Three times a day (TID) | ORAL | 0 refills | Status: AC | PRN

## 2022-05-02 MED ORDER — ALBUTEROL SULFATE HFA 108 (90 BASE) MCG/ACT IN AERS: 2.0000 | INHALATION_SPRAY | Freq: Four times a day (QID) | RESPIRATORY_TRACT | 2 refills | Status: AC | PRN

## 2022-05-02 MED ORDER — SODIUM CHLORIDE 0.9 % IV BOLUS
1000.0000 mL | Freq: Once | INTRAVENOUS | Status: AC
  Administered 2022-05-02: 1000 mL via INTRAVENOUS

## 2022-05-02 MED ORDER — NIRMATRELVIR/RITONAVIR (PAXLOVID)TABLET: 3.0000 | ORAL_TABLET | Freq: Two times a day (BID) | ORAL | 0 refills | Status: AC

## 2022-05-02 MED ORDER — CYCLOBENZAPRINE HCL 10 MG PO TABS
5.0000 mg | ORAL_TABLET | Freq: Once | ORAL | Status: AC
  Administered 2022-05-02: 5 mg via ORAL
  Filled 2022-05-02: qty 1

## 2022-05-02 NOTE — Discharge Instructions (Signed)
Please seek medical attention for any high fevers, chest pain, shortness of breath, change in behavior, persistent vomiting, bloody stool or any other new or concerning symptoms.  

## 2022-05-02 NOTE — ED Provider Notes (Signed)
[facility] Provider Note    Event Date/Time   First MD Initiated Contact with Patient 05/02/22 1655     (approximate)   History   Loss of Consciousness   HPI  Barbara Burch is a 66 y.o. female  who presents to the emergency department today after a syncopal episode at [facility] clinic when they were drawing blood. She presented to [facility] clinic today because of concern for fever, chills cough that started a little over a week ago. Family member had been sick prior to her becoming sick. Has been fever free for the past few days. Has started developing severe leg cramps. States she has history of potassium issues in the past. She says that shortly after the blood draw at [facility] clinic she started feeling weak and then passed out. Did not have any chest pain or palpitations. The patient says that when she was younger she would feel weak after blood draws.       Physical Exam   Triage Vital Signs: ED Triage Vitals [05/02/22 1611]  Enc Vitals Group     BP 126/74     Pulse Rate 78     Resp 18     Temp 98.9 F (37.2 C)     Temp Source Oral     SpO2 97 %     Weight 158 lb 1.1 oz (71.7 kg)     Height '4\' 10"'$  (1.473 m)     Head Circumference      Peak Flow      Pain Score 0     Pain Loc      Pain Edu?      Excl. in [place]?     Most recent vital signs: Vitals:   05/02/22 1611  BP: 126/74  Pulse: 78  Resp: 18  Temp: 98.9 F (37.2 C)  SpO2: 97%    General: Awake, alert, oriented. CV:  Good peripheral perfusion. Regular rate and rhythm. Resp:  Normal effort. Lungs clear. Abd:  No distention. No tenderness.    ED Results / Procedures / Treatments   Labs (all labs ordered are listed, but only abnormal results are displayed) Labs Reviewed  RESP PANEL BY RT-PCR (FLU A&B, COVID) ARPGX2 - Abnormal; Notable for the following components:      Result Value   SARS Coronavirus 2 by RT PCR POSITIVE (*)    All other components within normal limits   BASIC METABOLIC PANEL - Abnormal; Notable for the following components:   BUN 7 (*)    Calcium 8.8 (*)    All other components within normal limits  CBC - Abnormal; Notable for the following components:   MCH 25.4 (*)    Platelets 123 (*)    All other components within normal limits  URINALYSIS, ROUTINE W REFLEX MICROSCOPIC - Abnormal; Notable for the following components:   Color, Urine YELLOW (*)    APPearance CLEAR (*)    Hgb urine dipstick MODERATE (*)    All other components within normal limits  MAGNESIUM  CBG MONITORING, ED     EKG  I, Nance Pear, attending physician, personally viewed and interpreted this EKG  EKG Time: 1618 Rate: 76 Rhythm: normal sinus rhythm Axis: normal Intervals: qtc 441 QRS: narrow ST changes: no st elevation Impression: normal ekg   RADIOLOGY None  PROCEDURES:  Critical Care performed: No  Procedures   MEDICATIONS ORDERED IN ED: Medications - No data to display   IMPRESSION / MDM / ASSESSMENT  AND PLAN / ED COURSE  I reviewed the triage vital signs and the nursing notes.                              Differential diagnosis includes, but is not limited to, electrolyte abnormality, anemia, vaso vagal.  Patient's presentation is most consistent with acute presentation with potential threat to life or bodily function.   Patient presented to the emergency department today after syncopal episode that occurred at [facility] clinic where she presented because of concerns for fevers chills cough and leg cramps.  Patient was COVID-positive here in the emergency department.  Electrolytes without any concerning abnormalities.  Patient without any oxygen requirement.  Did discuss COVID diagnosis with the patient.  Will give prescription for Paxlovid as well as symptomatic treatment.  Patient did have leg cramps and did try giving muscle relaxer and gabapentin however patient stated no significant relief.  At this time I think likely  secondary to myalgias secondary to COVID.  FINAL CLINICAL IMPRESSION(S) / ED DIAGNOSES   Final diagnoses:  COVID-19    Note:  This document was prepared using Dragon voice recognition software and may include unintentional dictation errors.    Nance Pear, MD 05/02/22 2249

## 2022-05-02 NOTE — ED Triage Notes (Signed)
Pt here from [facility] r/t a syncopal episode. Pt had been coughing x3 days and went to [facility] for a follow up visit and when staff went to get blood work pt passed out. Pt also having leg cramps and diarrhea x3 days as well.

## 2022-06-26 ENCOUNTER — Other Ambulatory Visit: Payer: Self-pay | Admitting: Physician Assistant

## 2022-06-26 DIAGNOSIS — R748 Abnormal levels of other serum enzymes: Secondary | ICD-10-CM

## 2022-07-29 ENCOUNTER — Ambulatory Visit
Admission: RE | Admit: 2022-07-29 | Discharge: 2022-07-29 | Disposition: A | Discharge status: Home / Self Care | Payer: Medicare HMO | Source: Ambulatory Visit | Attending: Physician Assistant | Admitting: Physician Assistant

## 2022-07-29 DIAGNOSIS — R748 Abnormal levels of other serum enzymes: Secondary | ICD-10-CM | POA: Insufficient documentation

## 2023-03-12 ENCOUNTER — Other Ambulatory Visit: Payer: Self-pay | Admitting: Physician Assistant

## 2023-03-12 DIAGNOSIS — Z1231 Encounter for screening mammogram for malignant neoplasm of breast: Secondary | ICD-10-CM

## 2023-04-16 ENCOUNTER — Ambulatory Visit
Admission: RE | Admit: 2023-04-16 | Discharge: 2023-04-16 | Disposition: A | Discharge status: Home / Self Care | Payer: Medicare HMO | Source: Ambulatory Visit | Attending: Physician Assistant | Admitting: Physician Assistant

## 2023-04-16 DIAGNOSIS — Z1231 Encounter for screening mammogram for malignant neoplasm of breast: Secondary | ICD-10-CM | POA: Insufficient documentation

## 2023-10-19 ENCOUNTER — Other Ambulatory Visit: Payer: Self-pay | Admitting: Neurology

## 2023-10-19 DIAGNOSIS — D329 Benign neoplasm of meninges, unspecified: Secondary | ICD-10-CM

## 2023-10-23 ENCOUNTER — Ambulatory Visit
Admission: RE | Admit: 2023-10-23 | Discharge: 2023-10-23 | Disposition: A | Discharge status: Home / Self Care | Source: Ambulatory Visit | Attending: Neurology | Admitting: Neurology

## 2023-10-23 DIAGNOSIS — D329 Benign neoplasm of meninges, unspecified: Secondary | ICD-10-CM | POA: Insufficient documentation

## 2023-10-23 MED ORDER — GADOBUTROL 1 MMOL/ML IV SOLN
7.0000 mL | Freq: Once | INTRAVENOUS | Status: AC | PRN
  Administered 2023-10-23: 7 mL via INTRAVENOUS

## 2024-01-04 ENCOUNTER — Other Ambulatory Visit: Payer: Self-pay | Admitting: Physician Assistant

## 2024-01-04 DIAGNOSIS — Z1231 Encounter for screening mammogram for malignant neoplasm of breast: Secondary | ICD-10-CM

## 2024-04-18 ENCOUNTER — Ambulatory Visit
Admission: RE | Admit: 2024-04-18 | Discharge: 2024-04-18 | Disposition: A | Discharge status: Home / Self Care | Payer: PRIVATE HEALTH INSURANCE | Source: Ambulatory Visit | Attending: Physician Assistant | Admitting: Physician Assistant

## 2024-04-18 DIAGNOSIS — Z1231 Encounter for screening mammogram for malignant neoplasm of breast: Secondary | ICD-10-CM | POA: Diagnosis present
